# Patient Record
Sex: Female | Born: 1995 | Race: White | Hispanic: No | Smoking: Never smoker
Health system: Southern US, Community
[De-identification: ages and names within clinical notes are randomized; demographics above are authoritative.]

## PROBLEM LIST (undated history)

## (undated) DIAGNOSIS — E282 Polycystic ovarian syndrome: Secondary | ICD-10-CM

## (undated) DIAGNOSIS — J45909 Unspecified asthma, uncomplicated: Secondary | ICD-10-CM

## (undated) HISTORY — PX: WISDOM TOOTH EXTRACTION: SHX21

## (undated) HISTORY — PX: TONSILLECTOMY: SUR1361

## (undated) HISTORY — DX: Polycystic ovarian syndrome: E28.2

---

## 2017-08-15 DIAGNOSIS — E282 Polycystic ovarian syndrome: Secondary | ICD-10-CM | POA: Insufficient documentation

## 2020-03-28 ENCOUNTER — Emergency Department (HOSPITAL_COMMUNITY): Admission: EM | Admit: 2020-03-28 | Discharge: 2020-03-28 | Discharge status: Against Medical Advice | Payer: Self-pay

## 2020-03-28 ENCOUNTER — Other Ambulatory Visit: Payer: Self-pay

## 2020-12-17 ENCOUNTER — Other Ambulatory Visit: Payer: Self-pay

## 2020-12-17 ENCOUNTER — Encounter (HOSPITAL_COMMUNITY): Payer: Self-pay | Admitting: Emergency Medicine

## 2020-12-17 ENCOUNTER — Emergency Department (HOSPITAL_COMMUNITY): Payer: No Typology Code available for payment source

## 2020-12-17 ENCOUNTER — Emergency Department (HOSPITAL_COMMUNITY)
Admission: EM | Admit: 2020-12-17 | Discharge: 2020-12-17 | Disposition: A | Discharge status: Home / Self Care | Payer: No Typology Code available for payment source | Attending: Emergency Medicine | Admitting: Emergency Medicine

## 2020-12-17 DIAGNOSIS — S8001XA Contusion of right knee, initial encounter: Secondary | ICD-10-CM | POA: Diagnosis not present

## 2020-12-17 DIAGNOSIS — J45909 Unspecified asthma, uncomplicated: Secondary | ICD-10-CM | POA: Diagnosis not present

## 2020-12-17 DIAGNOSIS — S8991XA Unspecified injury of right lower leg, initial encounter: Secondary | ICD-10-CM | POA: Diagnosis present

## 2020-12-17 DIAGNOSIS — M7918 Myalgia, other site: Secondary | ICD-10-CM

## 2020-12-17 DIAGNOSIS — Y92410 Unspecified street and highway as the place of occurrence of the external cause: Secondary | ICD-10-CM | POA: Diagnosis not present

## 2020-12-17 HISTORY — DX: Unspecified asthma, uncomplicated: J45.909
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
## 2020-12-17 NOTE — Discharge Instructions (Signed)
You can take 600 mg of ibuprofen every 6 hours, you can take 1000 mg of Tylenol every 6 hours, you can alternate these every 3 or you can take them together.  Bear weight as tolerated with the crutches.  I start to feel better he can put weight on it.  If it is not getting better in a few days seek reevaluation as we may have missed a very small fracture.  Rest and elevate and ice the knee is much as possible.  Follow-up with your primary care

## 2020-12-17 NOTE — ED Notes (Signed)
E-signature pad unavailable at time of pt discharge. This RN discussed discharge materials with pt and answered all pt questions. Pt stated understanding of discharge material. ? ?
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
## 2021-01-06 ENCOUNTER — Other Ambulatory Visit: Payer: Self-pay | Admitting: Chiropractic Medicine

## 2021-01-06 DIAGNOSIS — M25561 Pain in right knee: Secondary | ICD-10-CM

## 2021-01-14 ENCOUNTER — Other Ambulatory Visit: Payer: Medicaid Other

## 2021-01-16 ENCOUNTER — Other Ambulatory Visit: Payer: Self-pay

## 2021-01-16 ENCOUNTER — Ambulatory Visit
Admission: RE | Admit: 2021-01-16 | Discharge: 2021-01-16 | Disposition: A | Discharge status: Home / Self Care | Payer: PRIVATE HEALTH INSURANCE | Source: Ambulatory Visit | Attending: Chiropractic Medicine | Admitting: Chiropractic Medicine

## 2021-01-16 DIAGNOSIS — M25561 Pain in right knee: Secondary | ICD-10-CM

## 2021-07-24 NOTE — L&D Delivery Note (Signed)
OB/GYN Faculty Practice Delivery Note  Kaitlyn Peters is a 26 y.o. G1P0 s/p SVD at [redacted]w[redacted]d. She was admitted for SOL/SROM.   ROM: 19h 53m with white, yellow fluid GBS Status:  Negative/-- (07/27 0000) Maximum Maternal Temperature: 100 w/ tachycardia (Treated w/ amp+gent for chorio)  Labor Progress: Initial SVE: 9/90/-1. She then progressed to complete.   Delivery Date/Time: 8/16 @2208   Delivery: Called to room and patient was complete and pushing. Head delivered OA. Loose nuchal cord x1 present; reduced at perineum after delivery. Shoulder and body delivered in usual fashion. Infant with spontaneous cry, placed on mother's abdomen, dried and stimulated. Cord clamped x 2 after 1-minute delay, and cut by FOB. Cord blood drawn. Placenta delivered spontaneously with gentle cord traction. Fundus firm with massage and Pitocin. Labia, perineum, vagina, and cervix inspected and no laceration found.  Baby Weight: pending  Placenta: 3 vessel, intact. Sent to L&D Complications: Prolonged second stage of labor Lacerations: None EBL: 57 mL Analgesia: Epidural   Infant:  APGAR (1 MIN): 9 APGAR (5 MINS): 9  Kaitlyn Alkins Autry-Lott, DO OB Fellow, Faculty , [facility] 03/08/2022, 10:51 PM

## 2021-07-27 ENCOUNTER — Encounter: Payer: Self-pay | Admitting: Advanced Practice Midwife

## 2021-07-27 NOTE — Progress Notes (Signed)
Pt states last pap was 2 years ago- normal

## 2021-08-02 ENCOUNTER — Encounter: Payer: Self-pay | Admitting: Advanced Practice Midwife

## 2021-08-02 ENCOUNTER — Other Ambulatory Visit: Payer: Self-pay

## 2021-08-02 ENCOUNTER — Ambulatory Visit (INDEPENDENT_AMBULATORY_CARE_PROVIDER_SITE_OTHER): Payer: Medicaid Other | Admitting: Advanced Practice Midwife

## 2021-08-02 ENCOUNTER — Other Ambulatory Visit (HOSPITAL_COMMUNITY)
Admission: RE | Admit: 2021-08-02 | Discharge: 2021-08-02 | Disposition: A | Discharge status: Home / Self Care | Payer: Medicaid Other | Source: Ambulatory Visit | Attending: Advanced Practice Midwife | Admitting: Advanced Practice Midwife

## 2021-08-02 VITALS — BP 102/67 | HR 86 | Wt 129.0 lb

## 2021-08-02 DIAGNOSIS — Z3490 Encounter for supervision of normal pregnancy, unspecified, unspecified trimester: Secondary | ICD-10-CM | POA: Insufficient documentation

## 2021-08-02 DIAGNOSIS — Z3480 Encounter for supervision of other normal pregnancy, unspecified trimester: Secondary | ICD-10-CM | POA: Diagnosis not present

## 2021-08-02 DIAGNOSIS — Z3A08 8 weeks gestation of pregnancy: Secondary | ICD-10-CM

## 2021-08-02 DIAGNOSIS — O219 Vomiting of pregnancy, unspecified: Secondary | ICD-10-CM

## 2021-08-02 DIAGNOSIS — O26891 Other specified pregnancy related conditions, first trimester: Secondary | ICD-10-CM | POA: Diagnosis not present

## 2021-08-02 MED ORDER — DOXYLAMINE-PYRIDOXINE 10-10 MG PO TBEC: DELAYED_RELEASE_TABLET | ORAL | 5 refills | Status: DC

## 2021-08-02 NOTE — Progress Notes (Signed)
Subjective:   Majorie Allanson is a 26 y.o. G1P0 at [redacted]w[redacted]d by early ultrasound being seen today for her first obstetrical visit.  Her obstetrical history is significant for  PCOS  and has Supervision of normal pregnancy and PCOS (polycystic ovarian syndrome) on their problem list.. Patient does intend to breast feed. Pregnancy history fully reviewed.  Patient reports  n/v and breast tenderness .  HISTORY: OB History  Gravida Para Term Preterm AB Living  1 0 0 0 0 0  SAB IAB Ectopic Multiple Live Births  0 0 0 0 0    # Outcome Date GA Lbr Len/2nd Weight Sex Delivery Anes PTL Lv  1 Current            Past Medical History:  Diagnosis Date   Asthma    PCOS (polycystic ovarian syndrome)    Past Surgical History:  Procedure Laterality Date   TONSILLECTOMY     Family History  Problem Relation Age of Onset   Breast cancer Maternal Grandmother    Breast cancer Paternal Grandmother    Social History   Tobacco Use   Smoking status: Never   Smokeless tobacco: Never  Vaping Use   Vaping Use: Never used  Substance Use Topics   Alcohol use: Never   Drug use: Never   No Known Allergies Current Outpatient Medications on File Prior to Visit  Medication Sig Dispense Refill   doxylamine, Sleep, (UNISOM) 25 MG tablet Take 25 mg by mouth at bedtime as needed.     Prenatal Vit-Fe Fumarate-FA (MULTIVITAMIN-PRENATAL) 27-0.8 MG TABS tablet Take 1 tablet by mouth daily at 12 noon.     promethazine (PHENERGAN) 25 MG suppository Place rectally.     No current facility-administered medications on file prior to visit.     Indications for ASA therapy (per uptodate) One of the following: Previous pregnancy with preeclampsia, especially early onset and with an adverse outcome No Multifetal gestation No Chronic hypertension No Type 1 or 2 diabetes mellitus No Chronic kidney disease No Autoimmune disease (antiphospholipid syndrome, systemic lupus erythematosus) No   Two or more of the  following: Nulliparity Yes Obesity (body mass index >30 kg/m2) No Family history of preeclampsia in mother or sister No Age ?35 years No Sociodemographic characteristics (African American race, low socioeconomic level) No Personal risk factors (eg, previous pregnancy with low birth weight or small for gestational age infant, previous adverse pregnancy outcome [eg, stillbirth], interval >10 years between pregnancies) No   Indications for early 1 hour GTT (per uptodate)  BMI >25 (>23 in Asian women) AND one of the following No,BMI 24   Exam   Vitals:   08/02/21 1404  BP: 102/67  Pulse: 86  Weight: 129 lb (58.5 kg)   Fetal Heart Rate (bpm): 165  Uterus:     Pelvic Exam: Perineum: no hemorrhoids, normal perineum   Vulva: normal external genitalia, no lesions   Vagina:  normal mucosa, normal discharge   Cervix: no lesions and normal, pap smear done.    Adnexa: normal adnexa and no mass, fullness, tenderness   Bony Pelvis: average  System: General: well-developed, well-nourished female in no acute distress   Breast:  normal appearance, no masses or tenderness   Skin: normal coloration and turgor, no rashes   Neurologic: oriented, normal, negative, normal mood   Extremities: normal strength, tone, and muscle mass, ROM of all joints is normal   HEENT PERRLA, extraocular movement intact and sclera clear, anicteric   Mouth/Teeth  mucous membranes moist, pharynx normal without lesions and dental hygiene good   Neck supple and no masses   Cardiovascular: regular rate and rhythm   Respiratory:  no respiratory distress, normal breath sounds   Abdomen: soft, non-tender; bowel sounds normal; no masses,  no organomegaly     Assessment:   Pregnancy: G1P0 Patient Active Problem List   Diagnosis Date Noted   Supervision of normal pregnancy 08/02/2021   PCOS (polycystic ovarian syndrome) 08/15/2017     Plan:    1. Supervision of other normal pregnancy, antepartum --Anticipatory  guidance about next visits/weeks of pregnancy given. --Next visit in 4 weeks --NIPS in 4 weeks  - Obstetric panel - HIV antibody (with reflex) - Hepatitis C Antibody - Culture, OB Urine - GC/Chlamydia probe amp ([place])not at [facility] - [place] bedside; Future - Babyscripts Schedule Optimization   2. Nausea and vomiting during pregnancy prior to [redacted] weeks gestation --Pt taking Phenergan given by a relative, helps but causes drowsiness. --Discussed dietary changes for nausea --Rx for Diclegis, start at night, then add daytime doses PRN - Doxylamine-Pyridoxine (DICLEGIS) 10-10 MG TBEC; Take 2 tabs at bedtime. If needed, add another tab in the morning. If needed, add another tab in the afternoon, up to 4 tabs/day.  Dispense: 100 tablet; Refill: 5   Initial labs drawn. Continue prenatal vitamins. Discussed and offered genetic screening options, including Quad screen/AFP, NIPS testing, and option to decline testing. Benefits/risks/alternatives reviewed. Pt aware that anatomy US is form of genetic screening with lower accuracy in detecting trisomies than blood work.  Pt chooses/declines genetic screening today. NIPS: requested. Ultrasound discussed; fetal anatomic survey: requested. Problem list reviewed and updated. The nature of [facility] with multiple MDs and other Advanced Practice Providers was explained to patient; also emphasized that residents, students are part of our team. Routine obstetric precautions reviewed. Return in about 4 weeks (around 08/30/2021).   Fatima Blank, CNM 08/02/21 3:34 PM

## 2021-08-02 NOTE — Progress Notes (Signed)
Bedside U/S shows single IUP with FHT of 165 BPM and CRL 18.48mm  GA is [redacted]w[redacted]d.  Pt does state that she has PCOS

## 2021-08-03 LAB — GC/CHLAMYDIA PROBE AMP (~~LOC~~) NOT AT ARMC
Chlamydia: NEGATIVE
Comment: NEGATIVE
Comment: NORMAL
Neisseria Gonorrhea: NEGATIVE

## 2021-08-04 LAB — OBSTETRIC PANEL
Absolute Monocytes: 646 cells/uL (ref 200–950)
Antibody Screen: NOT DETECTED
Basophils Absolute: 29 cells/uL (ref 0–200)
Basophils Relative: 0.3 %
Eosinophils Absolute: 38 cells/uL (ref 15–500)
Eosinophils Relative: 0.4 %
HCT: 38.9 % (ref 35.0–45.0)
Hemoglobin: 12.9 g/dL (ref 11.7–15.5)
Hepatitis B Surface Ag: NONREACTIVE
Lymphs Abs: 1720 cells/uL (ref 850–3900)
MCH: 30.3 pg (ref 27.0–33.0)
MCHC: 33.2 g/dL (ref 32.0–36.0)
MCV: 91.3 fL (ref 80.0–100.0)
MPV: 11.7 fL (ref 7.5–12.5)
Monocytes Relative: 6.8 %
Neutro Abs: 7068 cells/uL (ref 1500–7800)
Neutrophils Relative %: 74.4 %
Platelets: 283 10*3/uL (ref 140–400)
RBC: 4.26 10*6/uL (ref 3.80–5.10)
RDW: 12 % (ref 11.0–15.0)
RPR Ser Ql: NONREACTIVE
Rubella: 4.98 Index
Total Lymphocyte: 18.1 %
WBC: 9.5 10*3/uL (ref 3.8–10.8)

## 2021-08-04 LAB — HEPATITIS C ANTIBODY
Hepatitis C Ab: NONREACTIVE
SIGNAL TO CUT-OFF: 0.08 (ref <1.00)

## 2021-08-04 LAB — CULTURE, OB URINE

## 2021-08-04 LAB — HIV ANTIBODY (ROUTINE TESTING W REFLEX): HIV 1&2 Ab, 4th Generation: NONREACTIVE

## 2021-08-04 LAB — URINE CULTURE, OB REFLEX: Organism ID, Bacteria: NO GROWTH

## 2021-08-16 ENCOUNTER — Inpatient Hospital Stay (HOSPITAL_COMMUNITY)
Admission: AD | Admit: 2021-08-16 | Discharge: 2021-08-16 | Disposition: A | Discharge status: Home / Self Care | Payer: Medicaid Other | Attending: Obstetrics and Gynecology | Admitting: Obstetrics and Gynecology

## 2021-08-16 ENCOUNTER — Encounter (HOSPITAL_COMMUNITY): Payer: Self-pay | Admitting: Obstetrics and Gynecology

## 2021-08-16 ENCOUNTER — Other Ambulatory Visit: Payer: Self-pay

## 2021-08-16 ENCOUNTER — Encounter: Payer: Self-pay | Admitting: Advanced Practice Midwife

## 2021-08-16 DIAGNOSIS — O210 Mild hyperemesis gravidarum: Secondary | ICD-10-CM | POA: Insufficient documentation

## 2021-08-16 DIAGNOSIS — Z3491 Encounter for supervision of normal pregnancy, unspecified, first trimester: Secondary | ICD-10-CM

## 2021-08-16 DIAGNOSIS — O99611 Diseases of the digestive system complicating pregnancy, first trimester: Secondary | ICD-10-CM | POA: Insufficient documentation

## 2021-08-16 DIAGNOSIS — K529 Noninfective gastroenteritis and colitis, unspecified: Secondary | ICD-10-CM | POA: Insufficient documentation

## 2021-08-16 DIAGNOSIS — O219 Vomiting of pregnancy, unspecified: Secondary | ICD-10-CM

## 2021-08-16 DIAGNOSIS — Z3A10 10 weeks gestation of pregnancy: Secondary | ICD-10-CM | POA: Insufficient documentation

## 2021-08-16 DIAGNOSIS — R109 Unspecified abdominal pain: Secondary | ICD-10-CM | POA: Diagnosis present

## 2021-08-16 LAB — COMPREHENSIVE METABOLIC PANEL
ALT: 11 U/L (ref 0–44)
AST: 18 U/L (ref 15–41)
Albumin: 3.7 g/dL (ref 3.5–5.0)
Alkaline Phosphatase: 44 U/L (ref 38–126)
Anion gap: 20 — ABNORMAL HIGH (ref 5–15)
BUN: 7 mg/dL (ref 6–20)
CO2: 22 mmol/L (ref 22–32)
Calcium: 10.5 mg/dL — ABNORMAL HIGH (ref 8.9–10.3)
Chloride: 97 mmol/L — ABNORMAL LOW (ref 98–111)
Creatinine, Ser: 0.58 mg/dL (ref 0.44–1.00)
GFR, Estimated: >60 mL/min (ref >60)
Glucose, Bld: 73 mg/dL (ref 70–99)
Potassium: 3.4 mmol/L — ABNORMAL LOW (ref 3.5–5.1)
Sodium: 139 mmol/L (ref 135–145)
Total Bilirubin: 0.7 mg/dL (ref 0.3–1.2)
Total Protein: 6.7 g/dL (ref 6.5–8.1)

## 2021-08-16 LAB — CBC WITH DIFFERENTIAL/PLATELET
Abs Immature Granulocytes: 0.02 10*3/uL (ref 0.00–0.07)
Basophils Absolute: 0 10*3/uL (ref 0.0–0.1)
Basophils Relative: 0 %
Eosinophils Absolute: 0 10*3/uL (ref 0.0–0.5)
Eosinophils Relative: 0 %
HCT: 37 % (ref 36.0–46.0)
Hemoglobin: 12.9 g/dL (ref 12.0–15.0)
Immature Granulocytes: 0 %
Lymphocytes Relative: 18 %
Lymphs Abs: 1.9 10*3/uL (ref 0.7–4.0)
MCH: 30.5 pg (ref 26.0–34.0)
MCHC: 34.9 g/dL (ref 30.0–36.0)
MCV: 87.5 fL (ref 80.0–100.0)
Monocytes Absolute: 0.5 10*3/uL (ref 0.1–1.0)
Monocytes Relative: 5 %
Neutro Abs: 8.4 10*3/uL — ABNORMAL HIGH (ref 1.7–7.7)
Neutrophils Relative %: 77 %
Platelets: 198 10*3/uL (ref 150–400)
RBC: 4.23 MIL/uL (ref 3.87–5.11)
RDW: 11.9 % (ref 11.5–15.5)
WBC: 11 10*3/uL — ABNORMAL HIGH (ref 4.0–10.5)
nRBC: 0 % (ref 0.0–0.2)

## 2021-08-16 LAB — URINALYSIS, ROUTINE W REFLEX MICROSCOPIC
Bilirubin Urine: NEGATIVE
Glucose, UA: NEGATIVE mg/dL
Ketones, ur: 20 mg/dL — AB
Leukocytes,Ua: NEGATIVE
Nitrite: NEGATIVE
Protein, ur: NEGATIVE mg/dL
Specific Gravity, Urine: 1.025 (ref 1.005–1.030)
pH: 5 (ref 5.0–8.0)

## 2021-08-16 MED ORDER — METOCLOPRAMIDE HCL 10 MG PO TABS: 10.0000 mg | ORAL_TABLET | Freq: Four times a day (QID) | ORAL | 0 refills | Status: DC

## 2021-08-16 MED ORDER — LACTATED RINGERS IV BOLUS
1000.0000 mL | Freq: Once | INTRAVENOUS | Status: AC
  Administered 2021-08-16: 19:00:00 1000 mL via INTRAVENOUS

## 2021-08-16 MED ORDER — METOCLOPRAMIDE HCL 5 MG/ML IJ SOLN
10.0000 mg | Freq: Once | INTRAMUSCULAR | Status: AC
  Administered 2021-08-16: 19:00:00 10 mg via INTRAVENOUS
  Filled 2021-08-16: qty 2

## 2021-08-16 NOTE — MAU Note (Signed)
Presents with c/o N/V and diarrhea that began last night.  Reports unable to keep anything down since last night.  States took Diclegis and used phenergan suppository but unable to retain.

## 2021-08-16 NOTE — Discharge Instructions (Signed)

## 2021-08-16 NOTE — MAU Provider Note (Addendum)
History     CSN: [id number]  Arrival date and time: 08/16/21 1535   Event Date/Time   First Provider Initiated Contact with Patient 08/16/21 1805      Chief Complaint  Patient presents with   Emesis   Nausea   Diarrhea   Emesis  Associated symptoms include abdominal pain, chills and diarrhea. Pertinent negatives include no chest pain, coughing or fever.  Ms. Shanley Furlough is a 26 y.o. G1P0 at [redacted]w[redacted]d who presents to MAU today with complaint of nausea, vomiting, and diarrhea that began last night. Normally vomits 1-2x this pregnancy, but has vomited about 4-5x today and reports more bile-like/yellow color. Has been unable to tolerate any oral intake. Admits abdominal pain which also began last night which was originally relieved with defecation, but now reports constant 4-5/10 pain. Denies fever, blood in stool. Admits chills, fatigue. Has tried PO Diclegis and rectal Phenergan, but unable to keep either down secondary to vomiting and diarrhea. No new foods introduced to diet and no sick contacts, all home-cooked meals.   OB History     Gravida  1   Para      Term      Preterm      AB      Living         SAB      IAB      Ectopic      Multiple      Live Births              Past Medical History:  Diagnosis Date   Asthma    PCOS (polycystic ovarian syndrome)     Past Surgical History:  Procedure Laterality Date   TONSILLECTOMY      Family History  Problem Relation Age of Onset   Breast cancer Maternal Grandmother    Breast cancer Paternal Grandmother     Social History   Tobacco Use   Smoking status: Never   Smokeless tobacco: Never  Vaping Use   Vaping Use: Never used  Substance Use Topics   Alcohol use: Never   Drug use: Never    Allergies: No Known Allergies  Medications Prior to Admission  Medication Sig Dispense Refill Last Dose   doxylamine, Sleep, (UNISOM) 25 MG tablet Take 25 mg by mouth at bedtime as needed.   08/16/2021    Doxylamine-Pyridoxine (DICLEGIS) 10-10 MG TBEC Take 2 tabs at bedtime. If needed, add another tab in the morning. If needed, add another tab in the afternoon, up to 4 tabs/day. 100 tablet 5 08/16/2021   Prenatal Vit-Fe Fumarate-FA (MULTIVITAMIN-PRENATAL) 27-0.8 MG TABS tablet Take 1 tablet by mouth daily at 12 noon.   08/16/2021   promethazine (PHENERGAN) 25 MG suppository Place rectally.   Past Week    Review of Systems  Constitutional:  Positive for chills and fatigue. Negative for fever.  Respiratory:  Negative for cough and shortness of breath.   Cardiovascular:  Negative for chest pain.  Gastrointestinal:  Positive for abdominal pain, diarrhea, nausea and vomiting. Negative for blood in stool.  Genitourinary:  Positive for frequency. Negative for dysuria and urgency.   Physical Exam   Blood pressure 102/63, pulse 80, temperature 98.2 F (36.8 C), temperature source Oral, resp. rate 20, height 5\' 1"  (1.549 m), weight 59.2 kg, last menstrual period 05/20/2021, SpO2 99 %.  Physical Exam Vitals and nursing note reviewed.  Constitutional:      Appearance: Normal appearance.  Cardiovascular:     Rate and Rhythm:  Normal rate and regular rhythm.     Heart sounds: Normal heart sounds.  Pulmonary:     Effort: Pulmonary effort is normal.     Breath sounds: Normal breath sounds.  Abdominal:     General: Bowel sounds are normal.     Palpations: Abdomen is soft.     Tenderness: There is abdominal tenderness in the periumbilical area and left upper quadrant.  Neurological:     Mental Status: She is alert.    MAU Course  Procedures  UA shows hazy urine, large Hgb dipstick and few bacteria. Urine culture ordered. CMP, CBC ordered.  Given IV Phenergan and bolus LR.  Assessment and Plan  Acute gastroenteritis  Ronney Asters, Student-PA   CNM attestation:  I have seen and examined this patient and agree with above documentation in the PA student's note.   Kamree Wiens is a 26 y.o. G1P0  at [redacted]w[redacted]d reporting nausea, vomiting, and diarrhea that started last night. Reports symptoms started after eating dinner last night. Reports 4-5 episodes of vomiting today and 3 episodes of watery diarrhea. She reports that she tried to take both diclegis and phenergan suppository, however was unable to keep diclegis down and phenergan came out after an episode of diarrhea. She reports some mild abdominal cramping, however denies vaginal bleeding or discharge. She denies fever, chills, urinary s/s or exposure to sick contacts.   PE: Patient Vitals for the past 24 hrs:  BP Temp Temp src Pulse Resp SpO2 Height Weight  08/16/21 1606 102/63 98.2 F (36.8 C) Oral 80 20 99 % -- --  08/16/21 1600 -- -- -- -- -- -- 5\' 1"  (1.549 m) 59.2 kg   Gen: calm comfortable, NAD Resp: normal effort, no distress Heart: regular rate Abd: soft, nontender  FHR: 172 bpm via doppler  ROS, labs, PMH reviewed  Orders Placed This Encounter  Procedures   OB Urine Culture   Urinalysis, Routine w reflex microscopic Urine, Clean Catch   CBC with Differential/Platelet   Comprehensive metabolic panel   Insert peripheral IV   Discharge patient   Meds ordered this encounter  Medications   lactated ringers bolus 1,000 mL   metoCLOPramide (REGLAN) injection 10 mg   metoCLOPramide (REGLAN) 10 MG tablet    Sig: Take 1 tablet (10 mg total) by mouth every 6 (six) hours.    Dispense:  30 tablet    Refill:  0    Order Specific Question:   Supervising Provider    Answer:   [[id number]]    MDM UA, culture pending IV fluids and IV Reglan given CBC, CMP reassuring. No electrolyte abnormalities Patient was able to tolerate PO fluids and crackers. Reports feeling much better on reassessment.  Assessment: [redacted] weeks gestation of pregnancy Nausea and vomiting affecting pregnancy Acute gastroenteritis    Plan: - Discharge home in stable condition - Rx for Reglan sent to pharmacy - Strict return precautions  reviewed. Return to MAU sooner or as needed for worsening symptoms - Follow-up as scheduled at your doctor's office for next prenatal visit or sooner as needed if symptoms worsen.    Reva Bores, CNM 08/16/2021 9:03 PM

## 2021-08-18 LAB — CULTURE, OB URINE: Culture: 50000 — AB

## 2021-08-30 ENCOUNTER — Other Ambulatory Visit: Payer: Self-pay

## 2021-08-30 ENCOUNTER — Ambulatory Visit (INDEPENDENT_AMBULATORY_CARE_PROVIDER_SITE_OTHER): Payer: Medicaid Other | Admitting: Advanced Practice Midwife

## 2021-08-30 VITALS — BP 106/64 | HR 86 | Wt 133.0 lb

## 2021-08-30 DIAGNOSIS — R102 Pelvic and perineal pain: Secondary | ICD-10-CM

## 2021-08-30 DIAGNOSIS — Z3A12 12 weeks gestation of pregnancy: Secondary | ICD-10-CM | POA: Diagnosis not present

## 2021-08-30 DIAGNOSIS — O26891 Other specified pregnancy related conditions, first trimester: Secondary | ICD-10-CM

## 2021-08-30 DIAGNOSIS — Z3A19 19 weeks gestation of pregnancy: Secondary | ICD-10-CM

## 2021-08-30 DIAGNOSIS — Z3491 Encounter for supervision of normal pregnancy, unspecified, first trimester: Secondary | ICD-10-CM

## 2021-08-30 DIAGNOSIS — O219 Vomiting of pregnancy, unspecified: Secondary | ICD-10-CM

## 2021-08-30 NOTE — Progress Notes (Signed)
° °  PRENATAL VISIT NOTE  Subjective:  Kaitlyn Peters is a 26 y.o. G1P0 at [redacted]w[redacted]d being seen today for ongoing prenatal care.  She is currently monitored for the following issues for this low-risk pregnancy and has Supervision of normal pregnancy and PCOS (polycystic ovarian syndrome) on their problem list.  Patient reports  round ligament pain, pelvic/hip pain making uncomfortable for sleep .  Contractions: Not present. Vag. Bleeding: None.  Movement: Absent. Denies leaking of fluid.   The following portions of the patient's history were reviewed and updated as appropriate: allergies, current medications, past family history, past medical history, past social history, past surgical history and problem list.   Objective:   Vitals:   08/30/21 1338  BP: 106/64  Pulse: 86  Weight: 133 lb (60.3 kg)    Fetal Status: Fetal Heart Rate (bpm): 158   Movement: Absent     General:  Alert, oriented and cooperative. Patient is in no acute distress.  Skin: Skin is warm and dry. No rash noted.   Cardiovascular: Normal heart rate noted  Respiratory: Normal respiratory effort, no problems with respiration noted  Abdomen: Soft, gravid, appropriate for gestational age.  Pain/Pressure: Absent     Pelvic: Cervical exam deferred        Extremities: Normal range of motion.  Edema: None  Mental Status: Normal mood and affect. Normal behavior. Normal judgment and thought content.   Assessment and Plan:  Pregnancy: G1P0 at [redacted]w[redacted]d 1. Encounter for supervision of normal pregnancy in first trimester, unspecified gravidity --Anticipatory guidance about next visits/weeks of pregnancy given.  --next visit in 7 weeks, at 20 weeks per Baby Rx opt schedule  - Culture, OB Urine - Genetic Screening - US MFM OB COMP + 14 WK; Future 2. [redacted] weeks gestation of pregnancy   3. Nausea and vomiting during pregnancy prior to [redacted] weeks gestation --improved at this time, not taking medications  4. Pelvic pain affecting  pregnancy in first trimester, antepartum --Mild, mostly making it difficulty to get comfortable to sleep --Rest/ice/heat/warm bath/increase PO fluids/Tylenol/pregnancy support belt after 19-20 weeks --Urine culture today   Preterm labor symptoms and general obstetric precautions including but not limited to vaginal bleeding, contractions, leaking of fluid and fetal movement were reviewed in detail with the patient. Please refer to After Visit Summary for other counseling recommendations.   Return in about 7 weeks (around 10/18/2021).  Future Appointments  Date Time Provider Department Center  10/18/2021  1:10 PM Rasch, Harolyn Rutherford, NP [facility]    Sharen Counter, CNM

## 2021-09-02 LAB — URINE CULTURE, OB REFLEX

## 2021-09-02 LAB — CULTURE, OB URINE

## 2021-09-08 ENCOUNTER — Encounter: Payer: Self-pay | Admitting: *Deleted

## 2021-09-08 ENCOUNTER — Encounter: Payer: Self-pay | Admitting: Advanced Practice Midwife

## 2021-09-08 DIAGNOSIS — Z3491 Encounter for supervision of normal pregnancy, unspecified, first trimester: Secondary | ICD-10-CM

## 2021-09-13 ENCOUNTER — Other Ambulatory Visit: Payer: Self-pay | Admitting: Advanced Practice Midwife

## 2021-09-13 ENCOUNTER — Encounter: Payer: Self-pay | Admitting: Advanced Practice Midwife

## 2021-09-13 DIAGNOSIS — G43009 Migraine without aura, not intractable, without status migrainosus: Secondary | ICD-10-CM

## 2021-09-13 MED ORDER — BUTALBITAL-APAP-CAFFEINE 50-325-40 MG PO TABS: 1.0000 | ORAL_TABLET | Freq: Four times a day (QID) | ORAL | 0 refills | Status: DC | PRN

## 2021-09-13 NOTE — Progress Notes (Signed)
Pt sent MyChart message with  migraine h/a not resolved with Tylenol and icepacks. Rx for Fioricet sent to pharmacy.  Pt given instructions to go to MAU if h/a not resolved with increased PO fluids, Tylenol with or without caffeine, or Fioricet.

## 2021-10-17 ENCOUNTER — Other Ambulatory Visit: Payer: Self-pay | Admitting: *Deleted

## 2021-10-17 ENCOUNTER — Other Ambulatory Visit: Payer: Self-pay

## 2021-10-17 ENCOUNTER — Ambulatory Visit: Payer: Medicaid Other | Attending: Advanced Practice Midwife

## 2021-10-17 ENCOUNTER — Other Ambulatory Visit: Payer: Self-pay | Admitting: Advanced Practice Midwife

## 2021-10-17 DIAGNOSIS — O358XX0 Maternal care for other (suspected) fetal abnormality and damage, not applicable or unspecified: Secondary | ICD-10-CM | POA: Insufficient documentation

## 2021-10-17 DIAGNOSIS — Z3A19 19 weeks gestation of pregnancy: Secondary | ICD-10-CM | POA: Diagnosis not present

## 2021-10-17 DIAGNOSIS — Z3491 Encounter for supervision of normal pregnancy, unspecified, first trimester: Secondary | ICD-10-CM

## 2021-10-17 DIAGNOSIS — O3503X0 Maternal care for (suspected) central nervous system malformation or damage in fetus, choroid plexus cysts, not applicable or unspecified: Secondary | ICD-10-CM

## 2021-10-17 DIAGNOSIS — O321XX0 Maternal care for breech presentation, not applicable or unspecified: Secondary | ICD-10-CM | POA: Diagnosis not present

## 2021-10-17 DIAGNOSIS — Z363 Encounter for antenatal screening for malformations: Secondary | ICD-10-CM | POA: Insufficient documentation

## 2021-10-17 DIAGNOSIS — Z362 Encounter for other antenatal screening follow-up: Secondary | ICD-10-CM

## 2021-10-18 ENCOUNTER — Ambulatory Visit (INDEPENDENT_AMBULATORY_CARE_PROVIDER_SITE_OTHER): Payer: Medicaid Other | Admitting: Obstetrics and Gynecology

## 2021-10-18 DIAGNOSIS — Z3491 Encounter for supervision of normal pregnancy, unspecified, first trimester: Secondary | ICD-10-CM

## 2021-10-18 NOTE — Patient Instructions (Signed)

## 2021-10-18 NOTE — Progress Notes (Signed)
? ?  PRENATAL VISIT NOTE ? ?Subjective:  ?Kaitlyn Peters is a 26 y.o. G1P0 at [redacted]w[redacted]d being seen today for ongoing prenatal care.  She is currently monitored for the following issues for this low-risk pregnancy and has Supervision of normal pregnancy and PCOS (polycystic ovarian syndrome) on their problem list. ? ?Patient reports headache.   . Vag. Bleeding: None.  Movement: Present. Denies leaking of fluid.  ? ?The following portions of the patient's history were reviewed and updated as appropriate: allergies, current medications, past family history, past medical history, past social history, past surgical history and problem list.  ? ?Objective:  ? ?Vitals:  ? 10/18/21 1311  ?BP: 99/64  ?Pulse: 75  ?Weight: 145 lb (65.8 kg)  ? ? ?Fetal Status: Fetal Heart Rate (bpm): 148   Movement: Present    ? ?General:  Alert, oriented and cooperative. Patient is in no acute distress.  ?Skin: Skin is warm and dry. No rash noted.   ?Cardiovascular: Normal heart rate noted  ?Respiratory: Normal respiratory effort, no problems with respiration noted  ?Abdomen: Soft, gravid, appropriate for gestational age.  Pain/Pressure: Absent     ?Pelvic: Cervical exam deferred        ?Extremities: Normal range of motion.  Edema: None  ?Mental Status: Normal mood and affect. Normal behavior. Normal judgment and thought content.  ? ?Assessment and Plan:  ?Pregnancy: G1P0 at [redacted]w[redacted]d ?1. Encounter for supervision of normal pregnancy in first trimester, unspecified gravidity ? ?Doing well ?No concerns ?Requesting pap records again today.  ? ? ?Preterm labor symptoms and general obstetric precautions including but not limited to vaginal bleeding, contractions, leaking of fluid and fetal movement were reviewed in detail with the patient. ?Please refer to After Visit Summary for other counseling recommendations.  ? ?No follow-ups on file. ? ?Future Appointments  ?Date Time Provider Department Center  ?11/17/2021  1:15 PM [facility] NURSE [facility]  ?11/17/2021   1:30 PM [facility] US2 [facility]  ? ? ?Venia Carbon, NP  ?

## 2021-10-21 ENCOUNTER — Encounter: Payer: Self-pay | Admitting: *Deleted

## 2021-11-17 ENCOUNTER — Ambulatory Visit: Payer: Medicaid Other | Admitting: *Deleted

## 2021-11-17 ENCOUNTER — Ambulatory Visit: Payer: Medicaid Other | Attending: Obstetrics and Gynecology

## 2021-11-17 VITALS — BP 106/59 | HR 87

## 2021-11-17 DIAGNOSIS — Z3A23 23 weeks gestation of pregnancy: Secondary | ICD-10-CM

## 2021-11-17 DIAGNOSIS — Z3492 Encounter for supervision of normal pregnancy, unspecified, second trimester: Secondary | ICD-10-CM

## 2021-11-17 DIAGNOSIS — O321XX0 Maternal care for breech presentation, not applicable or unspecified: Secondary | ICD-10-CM | POA: Insufficient documentation

## 2021-11-17 DIAGNOSIS — O358XX0 Maternal care for other (suspected) fetal abnormality and damage, not applicable or unspecified: Secondary | ICD-10-CM | POA: Diagnosis not present

## 2021-11-17 DIAGNOSIS — Z362 Encounter for other antenatal screening follow-up: Secondary | ICD-10-CM | POA: Diagnosis not present

## 2021-11-17 DIAGNOSIS — O3503X0 Maternal care for (suspected) central nervous system malformation or damage in fetus, choroid plexus cysts, not applicable or unspecified: Secondary | ICD-10-CM

## 2021-11-21 ENCOUNTER — Inpatient Hospital Stay (HOSPITAL_COMMUNITY): Payer: Medicaid Other

## 2021-11-21 ENCOUNTER — Inpatient Hospital Stay (HOSPITAL_COMMUNITY)
Admission: AD | Admit: 2021-11-21 | Discharge: 2021-11-22 | Disposition: A | Discharge status: Home / Self Care | Payer: Medicaid Other | Attending: Obstetrics and Gynecology | Admitting: Obstetrics and Gynecology

## 2021-11-21 ENCOUNTER — Other Ambulatory Visit: Payer: Self-pay

## 2021-11-21 ENCOUNTER — Encounter (HOSPITAL_COMMUNITY): Payer: Self-pay | Admitting: Obstetrics and Gynecology

## 2021-11-21 DIAGNOSIS — R109 Unspecified abdominal pain: Secondary | ICD-10-CM | POA: Diagnosis not present

## 2021-11-21 DIAGNOSIS — M549 Dorsalgia, unspecified: Secondary | ICD-10-CM | POA: Insufficient documentation

## 2021-11-21 DIAGNOSIS — O26892 Other specified pregnancy related conditions, second trimester: Secondary | ICD-10-CM | POA: Diagnosis not present

## 2021-11-21 DIAGNOSIS — Z3492 Encounter for supervision of normal pregnancy, unspecified, second trimester: Secondary | ICD-10-CM

## 2021-11-21 DIAGNOSIS — Z3A24 24 weeks gestation of pregnancy: Secondary | ICD-10-CM | POA: Diagnosis not present

## 2021-11-21 DIAGNOSIS — O99891 Other specified diseases and conditions complicating pregnancy: Secondary | ICD-10-CM | POA: Diagnosis not present

## 2021-11-21 LAB — URINALYSIS, ROUTINE W REFLEX MICROSCOPIC
Bilirubin Urine: NEGATIVE
Glucose, UA: NEGATIVE mg/dL
Ketones, ur: NEGATIVE mg/dL
Leukocytes,Ua: NEGATIVE
Nitrite: NEGATIVE
Protein, ur: NEGATIVE mg/dL
Specific Gravity, Urine: 1.017 (ref 1.005–1.030)
pH: 5 (ref 5.0–8.0)

## 2021-11-21 LAB — COMPREHENSIVE METABOLIC PANEL
ALT: 15 U/L (ref 0–44)
AST: 17 U/L (ref 15–41)
Albumin: 2.8 g/dL — ABNORMAL LOW (ref 3.5–5.0)
Alkaline Phosphatase: 82 U/L (ref 38–126)
Anion gap: 9 (ref 5–15)
BUN: 8 mg/dL (ref 6–20)
CO2: 23 mmol/L (ref 22–32)
Calcium: 9.2 mg/dL (ref 8.9–10.3)
Chloride: 103 mmol/L (ref 98–111)
Creatinine, Ser: 0.44 mg/dL (ref 0.44–1.00)
GFR, Estimated: >60 mL/min (ref >60)
Glucose, Bld: 79 mg/dL (ref 70–99)
Potassium: 3.4 mmol/L — ABNORMAL LOW (ref 3.5–5.1)
Sodium: 135 mmol/L (ref 135–145)
Total Bilirubin: 0.4 mg/dL (ref 0.3–1.2)
Total Protein: 6 g/dL — ABNORMAL LOW (ref 6.5–8.1)

## 2021-11-21 LAB — CBC
HCT: 32.4 % — ABNORMAL LOW (ref 36.0–46.0)
Hemoglobin: 11.2 g/dL — ABNORMAL LOW (ref 12.0–15.0)
MCH: 30.5 pg (ref 26.0–34.0)
MCHC: 34.6 g/dL (ref 30.0–36.0)
MCV: 88.3 fL (ref 80.0–100.0)
Platelets: 219 10*3/uL (ref 150–400)
RBC: 3.67 MIL/uL — ABNORMAL LOW (ref 3.87–5.11)
RDW: 12.4 % (ref 11.5–15.5)
WBC: 15.3 10*3/uL — ABNORMAL HIGH (ref 4.0–10.5)
nRBC: 0 % (ref 0.0–0.2)

## 2021-11-21 MED ORDER — ACETAMINOPHEN 500 MG PO TABS
1000.0000 mg | ORAL_TABLET | Freq: Once | ORAL | Status: AC
  Administered 2021-11-21: 1000 mg via ORAL
  Filled 2021-11-21: qty 2

## 2021-11-21 MED ORDER — CYCLOBENZAPRINE HCL 5 MG PO TABS
10.0000 mg | ORAL_TABLET | Freq: Once | ORAL | Status: AC
  Administered 2021-11-21: 10 mg via ORAL
  Filled 2021-11-21: qty 2

## 2021-11-21 NOTE — MAU Provider Note (Signed)
?History  ?  ? ?[id number] ? ?Arrival date and time: 11/21/21 2115 ?  ? ?Chief Complaint  ?Patient presents with  ? Abdominal Pain  ? ? ? ?HPI ?Kaitlyn Peters is a 26 y.o. at [redacted]w[redacted]d by ultrasound who presents for back pain. ?Symptoms started yesterday. Reports constant pain on the right side of her back. Nothing makes better or worse. Hasn't treated symptoms. This evening has felt pain radiate to her abdomen intermittently - occurs about 2 times per hour. Denies fever, n/v/d, dysuria, hematuria, vaginal bleeding, or LOF. Reports good fetal movement. No recent injuries.   ? ?OB History   ? ? Gravida  ?1  ? Para  ?   ? Term  ?   ? Preterm  ?   ? AB  ?   ? Living  ?   ?  ? ? SAB  ?   ? IAB  ?   ? Ectopic  ?   ? Multiple  ?   ? Live Births  ?   ?   ?  ?  ? ? ?Past Medical History:  ?Diagnosis Date  ? Asthma   ? PCOS (polycystic ovarian syndrome)   ? ? ?Past Surgical History:  ?Procedure Laterality Date  ? TONSILLECTOMY    ? WISDOM TOOTH EXTRACTION Bilateral   ? ? ?Family History  ?Problem Relation Age of Onset  ? Breast cancer Maternal Grandmother   ? Breast cancer Paternal Grandmother   ? ? ?No Known Allergies ? ?No current facility-administered medications on file prior to encounter.  ? ?Current Outpatient Medications on File Prior to Encounter  ?Medication Sig Dispense Refill  ? Prenatal Vit-Fe Fumarate-FA (MULTIVITAMIN-PRENATAL) 27-0.8 MG TABS tablet Take 1 tablet by mouth daily at 12 noon.    ? ? ? ?ROS ?Pertinent positives and negative per HPI, all others reviewed and negative ? ?Physical Exam  ? ?BP 98/63   Pulse 75   Temp 98.7 ?F (37.1 ?C) (Oral)   Resp 20   Ht 5\' 1"  (1.549 m)   Wt 70.2 kg   LMP 05/20/2021   SpO2 99%   BMI 29.25 kg/m?  ? ?Patient Vitals for the past 24 hrs: ? BP Temp Temp src Pulse Resp SpO2 Height Weight  ?11/21/21 2255 -- -- -- -- -- 99 % -- --  ?11/21/21 2250 -- -- -- -- -- 100 % -- --  ?11/21/21 2245 -- -- -- -- -- 99 % -- --  ?11/21/21 2240 -- -- -- -- -- 100 % -- --  ?11/21/21 2235  98/63 -- -- 75 -- 99 % -- --  ?11/21/21 2154 108/62 98.7 ?F (37.1 ?C) Oral 85 20 -- 5\' 1"  (1.549 m) 70.2 kg  ? ? ?Physical Exam ?Vitals and nursing note reviewed. Exam conducted with a chaperone present.  ?Constitutional:   ?   General: She is not in acute distress. ?   Appearance: She is well-developed. She is not toxic-appearing.  ?HENT:  ?   Head: Normocephalic and atraumatic.  ?Pulmonary:  ?   Effort: Pulmonary effort is normal. No respiratory distress.  ?Abdominal:  ?   Palpations: Abdomen is soft.  ?   Tenderness: There is no right CVA tenderness or left CVA tenderness.  ?Skin: ?   General: Skin is warm and dry.  ?Neurological:  ?   Mental Status: She is alert.  ?  ? ?Cervical Exam ?Dilation: Closed ?Effacement (%): Thick ?Exam by:: , NP ? ? ?FHT ?Baseline 145, moderate variability, 10x10  accels, variable decel x1 ?Toco: none ? ? ?Labs ?Results for orders placed or performed during the hospital encounter of 11/21/21 (from the past 24 hour(s))  ?Urinalysis, Routine w reflex microscopic     Status: Abnormal  ? Collection Time: 11/21/21 10:13 PM  ?Result Value Ref Range  ? Color, Urine YELLOW YELLOW  ? APPearance HAZY (A) CLEAR  ? Specific Gravity, Urine 1.017 1.005 - 1.030  ? pH 5.0 5.0 - 8.0  ? Glucose, UA NEGATIVE NEGATIVE mg/dL  ? Hgb urine dipstick MODERATE (A) NEGATIVE  ? Bilirubin Urine NEGATIVE NEGATIVE  ? Ketones, ur NEGATIVE NEGATIVE mg/dL  ? Protein, ur NEGATIVE NEGATIVE mg/dL  ? Nitrite NEGATIVE NEGATIVE  ? Leukocytes,Ua NEGATIVE NEGATIVE  ? RBC / HPF 0-5 0 - 5 RBC/hpf  ? WBC, UA 0-5 0 - 5 WBC/hpf  ? Bacteria, UA RARE (A) NONE SEEN  ? Squamous Epithelial / LPF 11-20 0 - 5  ? Mucus PRESENT   ?CBC     Status: Abnormal  ? Collection Time: 11/21/21 11:12 PM  ?Result Value Ref Range  ? WBC 15.3 (H) 4.0 - 10.5 K/uL  ? RBC 3.67 (L) 3.87 - 5.11 MIL/uL  ? Hemoglobin 11.2 (L) 12.0 - 15.0 g/dL  ? HCT 32.4 (L) 36.0 - 46.0 %  ? MCV 88.3 80.0 - 100.0 fL  ? MCH 30.5 26.0 - 34.0 pg  ? MCHC 34.6 30.0 -  36.0 g/dL  ? RDW 12.4 11.5 - 15.5 %  ? Platelets 219 150 - 400 K/uL  ? nRBC 0.0 0.0 - 0.2 %  ?Comprehensive metabolic panel     Status: Abnormal  ? Collection Time: 11/21/21 11:12 PM  ?Result Value Ref Range  ? Sodium 135 135 - 145 mmol/L  ? Potassium 3.4 (L) 3.5 - 5.1 mmol/L  ? Chloride 103 98 - 111 mmol/L  ? CO2 23 22 - 32 mmol/L  ? Glucose, Bld 79 70 - 99 mg/dL  ? BUN 8 6 - 20 mg/dL  ? Creatinine, Ser 0.44 0.44 - 1.00 mg/dL  ? Calcium 9.2 8.9 - 10.3 mg/dL  ? Total Protein 6.0 (L) 6.5 - 8.1 g/dL  ? Albumin 2.8 (L) 3.5 - 5.0 g/dL  ? AST 17 15 - 41 U/L  ? ALT 15 0 - 44 U/L  ? Alkaline Phosphatase 82 38 - 126 U/L  ? Total Bilirubin 0.4 0.3 - 1.2 mg/dL  ? GFR, Estimated >60 >60 mL/min  ? Anion gap 9 5 - 15  ? ? ?Imaging ?US RENAL ? ?Result Date: 11/21/2021 ?CLINICAL DATA:  Right flank pain.  Twenty-four weeks pregnant. EXAM: RENAL / URINARY TRACT ULTRASOUND COMPLETE COMPARISON:  None. FINDINGS: Right Kidney: Renal measurements: 12.3 x 4.3 x 4.9 cm = volume: 134 mL. Echogenicity within normal limits. No mass or hydronephrosis visualized. Left Kidney: Renal measurements: 12.6 x 4.2 x 4.5 cm = volume: 126 mL. Echogenicity within normal limits. No mass or hydronephrosis visualized. Bladder: Appears normal for degree of bladder distention. Bilateral ureteral jets are present. Other: None. IMPRESSION: Normal renal ultrasound. Electronically Signed   By: Darliss Cheney M.D.   On: 11/21/2021 23:39   ? ?MAU Course  ?Procedures ?Lab Orders    ?     Culture, OB Urine    ?     Urinalysis, Routine w reflex microscopic    ?     CBC    ?     Comprehensive metabolic panel    ? ?Meds ordered this encounter  ?Medications  ?  acetaminophen (TYLENOL) tablet 1,000 mg  ? cyclobenzaprine (FLEXERIL) tablet 10 mg  ? cyclobenzaprine (FLEXERIL) 10 MG tablet  ?  Sig: Take 1 tablet (10 mg total) by mouth at bedtime as needed for muscle spasms.  ?  Dispense:  20 tablet  ?  Refill:  0  ?  Order Specific Question:   Supervising Provider  ?  Answer:    CONSTANT, PEGGY [[id number]]  ? ?Imaging Orders    ?     US RENAL    ? ? ?MDM ?Patient reports right back/flank pain. She is afebrile, no CVA tenderness. U/a shows moderate hemoglobin which appears on previous urine samples. Renal ultrasound negative. Labs ordered & reviewed. WBC up to 15 - patient is afebrile & has no signs of infection.  ?Cervix closed/thick. Symptoms improved with flexeril.  ?Assessment and Plan  ? ?1. Back pain affecting pregnancy in second trimester   ?2. [redacted] weeks gestation of pregnancy   ? ?-Rx flexeril ?-urine culture pending ?-Reviewed reasons to return to MAU ? ?Judeth HornErin Charlei Ramsaran, NP ?11/22/21 ?1:14 AM ? ? ?

## 2021-11-21 NOTE — MAU Note (Signed)
Pt says started having back pain yesterday ?Then at 8a- today - pain radiated to abd  ?Friday - started feeling pressure to abd  ?[place]- [place] - called - told to come  ?Last sex- Thursday  ?

## 2021-11-22 DIAGNOSIS — M549 Dorsalgia, unspecified: Secondary | ICD-10-CM

## 2021-11-22 DIAGNOSIS — O99891 Other specified diseases and conditions complicating pregnancy: Secondary | ICD-10-CM

## 2021-11-22 DIAGNOSIS — Z3A24 24 weeks gestation of pregnancy: Secondary | ICD-10-CM | POA: Diagnosis not present

## 2021-11-22 MED ORDER — CYCLOBENZAPRINE HCL 10 MG PO TABS
10.0000 mg | ORAL_TABLET | Freq: Every evening | ORAL | 0 refills | Status: DC | PRN
Start: 2021-11-22 — End: 2022-12-24

## 2021-11-23 LAB — CULTURE, OB URINE: Special Requests: NORMAL

## 2021-12-06 ENCOUNTER — Ambulatory Visit (INDEPENDENT_AMBULATORY_CARE_PROVIDER_SITE_OTHER): Payer: Medicaid Other

## 2021-12-06 VITALS — BP 104/70 | HR 82 | Wt 156.0 lb

## 2021-12-06 DIAGNOSIS — O2242 Hemorrhoids in pregnancy, second trimester: Secondary | ICD-10-CM

## 2021-12-06 DIAGNOSIS — Z3A26 26 weeks gestation of pregnancy: Secondary | ICD-10-CM

## 2021-12-06 DIAGNOSIS — Z3492 Encounter for supervision of normal pregnancy, unspecified, second trimester: Secondary | ICD-10-CM

## 2021-12-06 MED ORDER — HYDROCORTISONE ACETATE 25 MG RE SUPP: 25.0000 mg | Freq: Two times a day (BID) | RECTAL | 1 refills | Status: DC

## 2021-12-06 NOTE — Patient Instructions (Signed)

## 2021-12-06 NOTE — Progress Notes (Signed)
? ?  PRENATAL VISIT NOTE ? ?Subjective:  ?Kaitlyn Peters is a 26 y.o. G1P0 at [redacted]w[redacted]d being seen today for ongoing prenatal care.  She is currently monitored for the following issues for this low-risk pregnancy and has Supervision of normal pregnancy and PCOS (polycystic ovarian syndrome) on their problem list. ? ?Patient reports  hemorrhoids .  Contractions: Not present. Vag. Bleeding: None.  Movement: Present. Denies leaking of fluid.  ? ?The following portions of the patient's history were reviewed and updated as appropriate: allergies, current medications, past family history, past medical history, past social history, past surgical history and problem list.  ? ?Objective:  ? ?Vitals:  ? 12/06/21 0851  ?BP: 104/70  ?Pulse: 82  ?Weight: 156 lb (70.8 kg)  ? ? ?Fetal Status: Fetal Heart Rate (bpm): 149 Fundal Height: 26 cm Movement: Present    ? ?General:  Alert, oriented and cooperative. Patient is in no acute distress.  ?Skin: Skin is warm and dry. No rash noted.   ?Cardiovascular: Normal heart rate noted  ?Respiratory: Normal respiratory effort, no problems with respiration noted  ?Abdomen: Soft, gravid, appropriate for gestational age.  Pain/Pressure: Absent     ?Pelvic: Cervical exam deferred        ?Extremities: Normal range of motion.  Edema: Trace  ?Mental Status: Normal mood and affect. Normal behavior. Normal judgment and thought content.  ? ?Assessment and Plan:  ?Pregnancy: G1P0 at [redacted]w[redacted]d ?1. Encounter for supervision of normal pregnancy in second trimester, unspecified gravidity ?- Routine OB. Doing well ?- GTT and labs today ? ?- 2Hr GTT w/ 1 Hr Carpenter 75 g ?- HIV antibody (with reflex) ?- CBC ?- RPR ? ?2. [redacted] weeks gestation of pregnancy ?- Endorses active fetal movement ?- FH appropriate ? ?3. Hemorrhoids during pregnancy in second trimester ?- Noticed 1 week ago, had some rectal bleeding several days ago but none since ?- Also having constipation, but has not tried anything ?- Patient does have 3 small  external hemorrhoids ?- Increase water and fiber intake. Avoid straining with BM's. List of safe meds provided. May use Miralax and stool softeners. Will rx anusol as well  ? ?- hydrocortisone (ANUSOL-HC) 25 MG suppository; Place 1 suppository (25 mg total) rectally 2 (two) times daily.  Dispense: 12 suppository; Refill: 1 ? ? ?Preterm labor symptoms and general obstetric precautions including but not limited to vaginal bleeding, contractions, leaking of fluid and fetal movement were reviewed in detail with the patient. ?Please refer to After Visit Summary for other counseling recommendations.  ? ?Return in about 2 weeks (around 12/20/2021). ? ?Future Appointments  ?Date Time Provider Department Center  ?12/20/2021  3:30 PM Brand Males, CNM [facility]  ? ? ?Brand Males, CNM ? ?

## 2021-12-07 LAB — 2HR GTT W 1 HR, CARPENTER, 75 G
Glucose, 1 Hr, Gest: 105 mg/dL (ref 65–179)
Glucose, 2 Hr, Gest: 99 mg/dL (ref 65–152)
Glucose, Fasting, Gest: 69 mg/dL (ref 65–91)

## 2021-12-07 LAB — CBC
HCT: 34.4 % — ABNORMAL LOW (ref 35.0–45.0)
Hemoglobin: 11.8 g/dL (ref 11.7–15.5)
MCH: 31 pg (ref 27.0–33.0)
MCHC: 34.3 g/dL (ref 32.0–36.0)
MCV: 90.3 fL (ref 80.0–100.0)
MPV: 11.4 fL (ref 7.5–12.5)
Platelets: 221 10*3/uL (ref 140–400)
RBC: 3.81 10*6/uL (ref 3.80–5.10)
RDW: 12.3 % (ref 11.0–15.0)
WBC: 13.1 10*3/uL — ABNORMAL HIGH (ref 3.8–10.8)

## 2021-12-07 LAB — HIV ANTIBODY (ROUTINE TESTING W REFLEX): HIV 1&2 Ab, 4th Generation: NONREACTIVE

## 2021-12-07 LAB — RPR: RPR Ser Ql: NONREACTIVE

## 2021-12-20 ENCOUNTER — Ambulatory Visit (INDEPENDENT_AMBULATORY_CARE_PROVIDER_SITE_OTHER): Payer: Medicaid Other

## 2021-12-20 VITALS — BP 107/66 | HR 94 | Wt 162.0 lb

## 2021-12-20 DIAGNOSIS — Z3A28 28 weeks gestation of pregnancy: Secondary | ICD-10-CM

## 2021-12-20 DIAGNOSIS — Z3403 Encounter for supervision of normal first pregnancy, third trimester: Secondary | ICD-10-CM

## 2021-12-20 NOTE — Progress Notes (Signed)
   PRENATAL VISIT NOTE  Subjective:  Kaitlyn Peters is a 26 y.o. G1P0 at [redacted]w[redacted]d being seen today for ongoing prenatal care.  She is currently monitored for the following issues for this low-risk pregnancy and has Supervision of normal pregnancy and PCOS (polycystic ovarian syndrome) on their problem list.  Patient reports no complaints.  Contractions: Not present. Vag. Bleeding: None.  Movement: Present. Denies leaking of fluid.   The following portions of the patient's history were reviewed and updated as appropriate: allergies, current medications, past family history, past medical history, past social history, past surgical history and problem list.   Objective:   Vitals:   12/20/21 1526  BP: 107/66  Pulse: 94  Weight: 162 lb (73.5 kg)    Fetal Status: Fetal Heart Rate (bpm): 147   Movement: Present     General:  Alert, oriented and cooperative. Patient is in no acute distress.  Skin: Skin is warm and dry. No rash noted.   Cardiovascular: Normal heart rate noted  Respiratory: Normal respiratory effort, no problems with respiration noted  Abdomen: Soft, gravid, appropriate for gestational age.  Pain/Pressure: Absent     Pelvic: Cervical exam deferred        Extremities: Normal range of motion.  Edema: Trace  Mental Status: Normal mood and affect. Normal behavior. Normal judgment and thought content.   Assessment and Plan:  Pregnancy: G1P0 at [redacted]w[redacted]d 1. Encounter for supervision of normal first pregnancy in third trimester - Routine OB. Doing well, no concerns - Reviewed pap results- LSIL. Patient reports she had something that "scraped my cervix", sounds like colpo. She never had any follow up. No records. Will need follow up pap. Offer at 36 weeks or postpartum  2. [redacted] weeks gestation of pregnancy - Endorses active fetal movement - FH appropriate   Preterm labor symptoms and general obstetric precautions including but not limited to vaginal bleeding, contractions, leaking of  fluid and fetal movement were reviewed in detail with the patient. Please refer to After Visit Summary for other counseling recommendations.   Return in about 2 weeks (around 01/03/2022).  Future Appointments  Date Time Provider Department Center  01/03/2022  3:50 PM Armando Reichert, CNM [facility]    Brand Males, CNM

## 2022-01-03 ENCOUNTER — Ambulatory Visit (INDEPENDENT_AMBULATORY_CARE_PROVIDER_SITE_OTHER): Payer: Medicaid Other | Admitting: Advanced Practice Midwife

## 2022-01-03 VITALS — BP 103/68 | HR 93 | Wt 165.0 lb

## 2022-01-03 DIAGNOSIS — Z3A30 30 weeks gestation of pregnancy: Secondary | ICD-10-CM

## 2022-01-03 DIAGNOSIS — Z3480 Encounter for supervision of other normal pregnancy, unspecified trimester: Secondary | ICD-10-CM

## 2022-01-03 NOTE — Progress Notes (Signed)
   PRENATAL VISIT NOTE  Subjective:  Kaitlyn Peters is a 26 y.o. G1P0 at [redacted]w[redacted]d being seen today for ongoing prenatal care.  She is currently monitored for the following issues for this low-risk pregnancy and has Supervision of normal pregnancy and PCOS (polycystic ovarian syndrome) on their problem list.  Patient reports no complaints.  Contractions: Not present. Vag. Bleeding: None.  Movement: Present. Denies leaking of fluid.   The following portions of the patient's history were reviewed and updated as appropriate: allergies, current medications, past family history, past medical history, past social history, past surgical history and problem list.   Objective:   Vitals:   01/03/22 1559  BP: 103/68  Pulse: 93  Weight: 165 lb (74.8 kg)    Fetal Status: Fetal Heart Rate (bpm): 141 Fundal Height: 30 cm Movement: Present     General:  Alert, oriented and cooperative. Patient is in no acute distress.  Skin: Skin is warm and dry. No rash noted.   Cardiovascular: Normal heart rate noted  Respiratory: Normal respiratory effort, no problems with respiration noted  Abdomen: Soft, gravid, appropriate for gestational age.  Pain/Pressure: Present     Pelvic: Cervical exam deferred        Extremities: Normal range of motion.  Edema: Trace  Mental Status: Normal mood and affect. Normal behavior. Normal judgment and thought content.   Assessment and Plan:  Pregnancy: G1P0 at [redacted]w[redacted]d 1. Supervision of other normal pregnancy, antepartum - routine care  2. [redacted] weeks gestation of pregnancy   Preterm labor symptoms and general obstetric precautions including but not limited to vaginal bleeding, contractions, leaking of fluid and fetal movement were reviewed in detail with the patient. Please refer to After Visit Summary for other counseling recommendations.   Return in about 2 weeks (around 01/17/2022).  Future Appointments  Date Time Provider Department Center  01/19/2022 11:10 AM Milas Hock,  MD [facility]    Thressa Sheller DNP, CNM  01/03/22  4:21 PM

## 2022-01-13 ENCOUNTER — Encounter: Payer: Self-pay | Admitting: Advanced Practice Midwife

## 2022-01-17 NOTE — Progress Notes (Signed)
   PRENATAL VISIT NOTE  Subjective:  Kaitlyn Peters is a 26 y.o. G1P0 at [redacted]w[redacted]d being seen today for ongoing prenatal care.  She is currently monitored for the following issues for this low-risk pregnancy and has Supervision of normal pregnancy and PCOS (polycystic ovarian syndrome) on their problem list.  Patient reports no complaints.  Contractions: Not present. Vag. Bleeding: None.  Movement: Present. Denies leaking of fluid.   The following portions of the patient's history were reviewed and updated as appropriate: allergies, current medications, past family history, past medical history, past social history, past surgical history and problem list.   Objective:   Vitals:   01/19/22 1107  BP: 106/71  Pulse: 90  Weight: 169 lb (76.7 kg)    Fetal Status: Fetal Heart Rate (bpm): 145   Movement: Present     General:  Alert, oriented and cooperative. Patient is in no acute distress.  Skin: Skin is warm and dry. No rash noted.   Cardiovascular: Normal heart rate noted  Respiratory: Normal respiratory effort, no problems with respiration noted  Abdomen: Soft, gravid, appropriate for gestational age.  Pain/Pressure: Present     Pelvic: Cervical exam deferred        Extremities: Normal range of motion.  Edema: Trace  Mental Status: Normal mood and affect. Normal behavior. Normal judgment and thought content.   Assessment and Plan:  Pregnancy: G1P0 at [redacted]w[redacted]d 1. Encounter for supervision of normal first pregnancy in third trimester Discussed contraception. She is initially leaning toward the patch but we reviewed not starting right away and potentially initially impacting milk production although typically transient. We also discussed ppIUD and POP until milk is more established as well. She will consider and we will discuss in the future.  Discussed TDAP and recommended - she reports she had it in our office - we did not document it. Based on documentation, most likely occurred on 5/16  appointment.  Rh positive   Preterm labor symptoms and general obstetric precautions including but not limited to vaginal bleeding, contractions, leaking of fluid and fetal movement were reviewed in detail with the patient. Please refer to After Visit Summary for other counseling recommendations.   Return in about 2 weeks (around 02/02/2022) for OB VISIT, MD or APP.  Future Appointments  Date Time Provider Department Center  02/02/2022  3:50 PM Milas Hock, MD [facility]  02/16/2022  1:50 PM Milas Hock, MD [facility]  02/23/2022  2:50 PM Milas Hock, MD [facility]  02/28/2022  1:30 PM Rasch, Harolyn Rutherford, NP [facility]  03/06/2022  1:50 PM Lesly Dukes, MD [facility]  03/14/2022  1:30 PM Donette Larry, CNM [facility]    Milas Hock, MD

## 2022-01-19 ENCOUNTER — Ambulatory Visit (INDEPENDENT_AMBULATORY_CARE_PROVIDER_SITE_OTHER): Payer: Medicaid Other | Admitting: Obstetrics and Gynecology

## 2022-01-19 ENCOUNTER — Encounter: Payer: Self-pay | Admitting: Obstetrics and Gynecology

## 2022-01-19 VITALS — BP 106/71 | HR 90 | Wt 169.0 lb

## 2022-01-19 DIAGNOSIS — Z3403 Encounter for supervision of normal first pregnancy, third trimester: Secondary | ICD-10-CM

## 2022-01-19 DIAGNOSIS — Z3A32 32 weeks gestation of pregnancy: Secondary | ICD-10-CM

## 2022-01-30 NOTE — Progress Notes (Unsigned)
   PRENATAL VISIT NOTE  Subjective:  Kaitlyn Peters is a 26 y.o. G1P0 at [redacted]w[redacted]d being seen today for ongoing prenatal care.  She is currently monitored for the following issues for this low-risk pregnancy and has Supervision of normal pregnancy and PCOS (polycystic ovarian syndrome) on their problem list.  Patient reports {sx:14538}.   .  .   . Denies leaking of fluid.   The following portions of the patient's history were reviewed and updated as appropriate: allergies, current medications, past family history, past medical history, past social history, past surgical history and problem list.   Objective:  There were no vitals filed for this visit.  Fetal Status:           General:  Alert, oriented and cooperative. Patient is in no acute distress.  Skin: Skin is warm and dry. No rash noted.   Cardiovascular: Normal heart rate noted  Respiratory: Normal respiratory effort, no problems with respiration noted  Abdomen: Soft, gravid, appropriate for gestational age.        Pelvic: {Blank single:19197::"Cervical exam performed in the presence of a chaperone","Cervical exam deferred"}        Extremities: Normal range of motion.     Mental Status: Normal mood and affect. Normal behavior. Normal judgment and thought content.   Assessment and Plan:  Pregnancy: G1P0 at [redacted]w[redacted]d 1. Encounter for supervision of normal first pregnancy in third trimester Continue routine PNC  Preterm labor symptoms and general obstetric precautions including but not limited to vaginal bleeding, contractions, leaking of fluid and fetal movement were reviewed in detail with the patient. Please refer to After Visit Summary for other counseling recommendations.   No follow-ups on file.  Future Appointments  Date Time Provider Department Center  02/02/2022  3:50 PM Milas Hock, MD [facility]  02/16/2022  1:50 PM Milas Hock, MD [facility]  02/23/2022  2:50 PM Milas Hock, MD [facility]   02/28/2022  1:30 PM Rasch, Harolyn Rutherford, NP [facility]  03/06/2022  1:50 PM Lesly Dukes, MD [facility]  03/14/2022  1:30 PM Donette Larry, CNM [facility]    Milas Hock, MD

## 2022-02-02 ENCOUNTER — Encounter: Payer: Self-pay | Admitting: Obstetrics and Gynecology

## 2022-02-02 ENCOUNTER — Ambulatory Visit (INDEPENDENT_AMBULATORY_CARE_PROVIDER_SITE_OTHER): Payer: Medicaid Other | Admitting: Obstetrics and Gynecology

## 2022-02-02 VITALS — BP 113/79 | HR 97 | Wt 175.0 lb

## 2022-02-02 DIAGNOSIS — Z3403 Encounter for supervision of normal first pregnancy, third trimester: Secondary | ICD-10-CM

## 2022-02-02 DIAGNOSIS — Z3A34 34 weeks gestation of pregnancy: Secondary | ICD-10-CM

## 2022-02-11 ENCOUNTER — Encounter: Payer: Self-pay | Admitting: Obstetrics and Gynecology

## 2022-02-14 NOTE — Progress Notes (Unsigned)
   PRENATAL VISIT NOTE  Subjective:  Kaitlyn Peters is a 26 y.o. G1P0 at [redacted]w[redacted]d being seen today for ongoing prenatal care.  She is currently monitored for the following issues for this low-risk pregnancy and has Supervision of normal pregnancy and PCOS (polycystic ovarian syndrome) on their problem list.  Patient reports {sx:14538}.   .  .   . Denies leaking of fluid.   The following portions of the patient's history were reviewed and updated as appropriate: allergies, current medications, past family history, past medical history, past social history, past surgical history and problem list.   Objective:  There were no vitals filed for this visit.  Fetal Status:           General:  Alert, oriented and cooperative. Patient is in no acute distress.  Skin: Skin is warm and dry. No rash noted.   Cardiovascular: Normal heart rate noted  Respiratory: Normal respiratory effort, no problems with respiration noted  Abdomen: Soft, gravid, appropriate for gestational age.        Pelvic: Cervical exam performed in the presence of a chaperone        Extremities: Normal range of motion.     Mental Status: Normal mood and affect. Normal behavior. Normal judgment and thought content.   Assessment and Plan:  Pregnancy: G1P0 at [redacted]w[redacted]d 1. Encounter for supervision of normal first pregnancy in third trimester GBS/GC/CT Offered pap smear today - ***   Preterm labor symptoms and general obstetric precautions including but not limited to vaginal bleeding, contractions, leaking of fluid and fetal movement were reviewed in detail with the patient. Please refer to After Visit Summary for other counseling recommendations.   No follow-ups on file.  Future Appointments  Date Time Provider Department Center  02/16/2022  1:50 PM Milas Hock, MD [facility]  02/23/2022  2:50 PM Milas Hock, MD [facility]  02/28/2022  1:30 PM Rasch, Harolyn Rutherford, NP [facility]  03/06/2022  1:50 PM  Lesly Dukes, MD [facility]  03/14/2022  1:30 PM Donette Larry, CNM [facility]    Milas Hock, MD

## 2022-02-16 ENCOUNTER — Ambulatory Visit (INDEPENDENT_AMBULATORY_CARE_PROVIDER_SITE_OTHER): Payer: Medicaid Other | Admitting: Obstetrics and Gynecology

## 2022-02-16 ENCOUNTER — Encounter: Payer: Self-pay | Admitting: Obstetrics and Gynecology

## 2022-02-16 ENCOUNTER — Other Ambulatory Visit (HOSPITAL_COMMUNITY)
Admission: RE | Admit: 2022-02-16 | Discharge: 2022-02-16 | Disposition: A | Discharge status: Home / Self Care | Payer: Medicaid Other | Source: Ambulatory Visit | Attending: Obstetrics and Gynecology | Admitting: Obstetrics and Gynecology

## 2022-02-16 VITALS — BP 118/82 | HR 96 | Wt 177.0 lb

## 2022-02-16 DIAGNOSIS — Z3403 Encounter for supervision of normal first pregnancy, third trimester: Secondary | ICD-10-CM | POA: Diagnosis present

## 2022-02-16 DIAGNOSIS — Z3A36 36 weeks gestation of pregnancy: Secondary | ICD-10-CM

## 2022-02-16 LAB — OB RESULTS CONSOLE GBS: GBS: NEGATIVE

## 2022-02-17 LAB — CERVICOVAGINAL ANCILLARY ONLY
Chlamydia: NEGATIVE
Comment: NEGATIVE
Comment: NORMAL
Neisseria Gonorrhea: NEGATIVE

## 2022-02-17 NOTE — Progress Notes (Unsigned)
   PRENATAL VISIT NOTE  Subjective:  Kaitlyn Peters is a 26 y.o. G1P0 at [redacted]w[redacted]d being seen today for ongoing prenatal care.  She is currently monitored for the following issues for this low-risk pregnancy and has Supervision of normal pregnancy and PCOS (polycystic ovarian syndrome) on their problem list.  Patient reports {[id number]}.   .  .   . Denies leaking of fluid.   The following portions of the patient's history were reviewed and updated as appropriate: allergies, current medications, past family history, past medical history, past social history, past surgical history and problem list.   Objective:  There were no vitals filed for this visit.  Fetal Status:           General:  Alert, oriented and cooperative. Patient is in no acute distress.  Skin: Skin is warm and dry. No rash noted.   Cardiovascular: Normal heart rate noted  Respiratory: Normal respiratory effort, no problems with respiration noted  Abdomen: Soft, gravid, appropriate for gestational age.        Pelvic: Cervical exam deferred        Extremities: Normal range of motion.     Mental Status: Normal mood and affect. Normal behavior. Normal judgment and thought content.   Assessment and Plan:  Pregnancy: G1P0 at [redacted]w[redacted]d 1. Encounter for supervision of normal first pregnancy in third trimester GBS *** Pap pending ***  Term labor symptoms and general obstetric precautions including but not limited to vaginal bleeding, contractions, leaking of fluid and fetal movement were reviewed in detail with the patient. Please refer to After Visit Summary for other counseling recommendations.   No follow-ups on file.  Future Appointments  Date Time Provider Department Center  02/23/2022  2:50 PM Milas Hock, MD [facility]  02/28/2022  1:30 PM Rasch, Harolyn Rutherford, NP [facility]  03/06/2022  1:50 PM Lesly Dukes, MD [facility]  03/14/2022  1:30 PM Donette Larry, CNM [facility]     Milas Hock, MD

## 2022-02-19 LAB — CULTURE, BETA STREP (GROUP B ONLY)
MICRO NUMBER:: 13702724
SPECIMEN QUALITY:: ADEQUATE

## 2022-02-20 LAB — CYTOLOGY - PAP
Adequacy: ABSENT
Chlamydia: NEGATIVE
Comment: NEGATIVE
Comment: NORMAL
Diagnosis: NEGATIVE
Neisseria Gonorrhea: NEGATIVE

## 2022-02-23 ENCOUNTER — Ambulatory Visit (INDEPENDENT_AMBULATORY_CARE_PROVIDER_SITE_OTHER): Payer: Medicaid Other | Admitting: Obstetrics and Gynecology

## 2022-02-23 ENCOUNTER — Encounter: Payer: Self-pay | Admitting: Obstetrics and Gynecology

## 2022-02-23 VITALS — BP 118/84 | HR 89 | Wt 179.0 lb

## 2022-02-23 DIAGNOSIS — Z3A37 37 weeks gestation of pregnancy: Secondary | ICD-10-CM

## 2022-02-23 DIAGNOSIS — Z3403 Encounter for supervision of normal first pregnancy, third trimester: Secondary | ICD-10-CM

## 2022-02-28 ENCOUNTER — Ambulatory Visit (INDEPENDENT_AMBULATORY_CARE_PROVIDER_SITE_OTHER): Payer: Medicaid Other | Admitting: Obstetrics and Gynecology

## 2022-02-28 DIAGNOSIS — Z3A38 38 weeks gestation of pregnancy: Secondary | ICD-10-CM

## 2022-02-28 DIAGNOSIS — Z3403 Encounter for supervision of normal first pregnancy, third trimester: Secondary | ICD-10-CM

## 2022-02-28 NOTE — Patient Instructions (Signed)

## 2022-02-28 NOTE — Progress Notes (Signed)
   PRENATAL VISIT NOTE  Subjective:  Kaitlyn Peters is a 26 y.o. G1P0 at [redacted]w[redacted]d being seen today for ongoing prenatal care.  She is currently monitored for the following issues for this low-risk pregnancy and has Supervision of normal pregnancy and PCOS (polycystic ovarian syndrome) on their problem list.  Patient reports no complaints.  Contractions: Irritability. Vag. Bleeding: None.  Movement: Present. Denies leaking of fluid.   The following portions of the patient's history were reviewed and updated as appropriate: allergies, current medications, past family history, past medical history, past social history, past surgical history and problem list.   Objective:   Vitals:   02/28/22 1328  BP: 125/81  Pulse: (!) 106  Weight: 180 lb (81.6 kg)    Fetal Status: Fetal Heart Rate (bpm): 151 Fundal Height: 38 cm Movement: Present  Presentation: Undeterminable  General:  Alert, oriented and cooperative. Patient is in no acute distress.  Skin: Skin is warm and dry. No rash noted.   Cardiovascular: Normal heart rate noted  Respiratory: Normal respiratory effort, no problems with respiration noted  Abdomen: Soft, gravid, appropriate for gestational age.  Pain/Pressure: Present     Pelvic: Cervical exam performed in the presence of a chaperone Dilation: 1 Effacement (%): 70 Station: -3  Extremities: Normal range of motion.  Edema: Trace  Mental Status: Normal mood and affect. Normal behavior. Normal judgment and thought content.   Assessment and Plan:  Pregnancy: G1P0 at [redacted]w[redacted]d 1. Encounter for supervision of normal first pregnancy in third trimester  - GBS negative  - Doing great.   Preterm labor symptoms and general obstetric precautions including but not limited to vaginal bleeding, contractions, leaking of fluid and fetal movement were reviewed in detail with the patient. Please refer to After Visit Summary for other counseling recommendations.   No follow-ups on file.  Future  Appointments  Date Time Provider Department Center  03/06/2022  1:50 PM Lesly Dukes, MD [facility]  03/14/2022  1:30 PM Donette Larry, CNM [facility]    Venia Carbon, NP

## 2022-03-04 ENCOUNTER — Inpatient Hospital Stay (HOSPITAL_COMMUNITY)
Admission: AD | Admit: 2022-03-04 | Discharge: 2022-03-04 | Disposition: A | Discharge status: Home / Self Care | Payer: Medicaid Other | Attending: Obstetrics and Gynecology | Admitting: Obstetrics and Gynecology

## 2022-03-04 ENCOUNTER — Encounter (HOSPITAL_COMMUNITY): Payer: Self-pay | Admitting: Obstetrics and Gynecology

## 2022-03-04 ENCOUNTER — Other Ambulatory Visit: Payer: Self-pay

## 2022-03-04 DIAGNOSIS — Z3A38 38 weeks gestation of pregnancy: Secondary | ICD-10-CM

## 2022-03-04 DIAGNOSIS — M549 Dorsalgia, unspecified: Secondary | ICD-10-CM | POA: Diagnosis not present

## 2022-03-04 DIAGNOSIS — O99891 Other specified diseases and conditions complicating pregnancy: Secondary | ICD-10-CM

## 2022-03-04 DIAGNOSIS — N764 Abscess of vulva: Secondary | ICD-10-CM

## 2022-03-04 DIAGNOSIS — Z3689 Encounter for other specified antenatal screening: Secondary | ICD-10-CM | POA: Diagnosis not present

## 2022-03-04 DIAGNOSIS — R109 Unspecified abdominal pain: Secondary | ICD-10-CM | POA: Insufficient documentation

## 2022-03-04 DIAGNOSIS — O26893 Other specified pregnancy related conditions, third trimester: Secondary | ICD-10-CM | POA: Insufficient documentation

## 2022-03-04 LAB — URINALYSIS, ROUTINE W REFLEX MICROSCOPIC
Bilirubin Urine: NEGATIVE
Glucose, UA: NEGATIVE mg/dL
Ketones, ur: NEGATIVE mg/dL
Nitrite: NEGATIVE
Protein, ur: NEGATIVE mg/dL
Specific Gravity, Urine: 1.004 — ABNORMAL LOW (ref 1.005–1.030)
pH: 6 (ref 5.0–8.0)

## 2022-03-04 MED ORDER — CYCLOBENZAPRINE HCL 5 MG PO TABS
10.0000 mg | ORAL_TABLET | Freq: Three times a day (TID) | ORAL | Status: DC | PRN
  Administered 2022-03-04: 10 mg via ORAL
  Filled 2022-03-04: qty 2

## 2022-03-04 MED ORDER — LIDOCAINE HCL (PF) 1 % IJ SOLN: 30.0000 mL | Freq: Once | INTRAMUSCULAR | Status: DC

## 2022-03-04 MED ORDER — LIDOCAINE HCL (PF) 1 % IJ SOLN
INTRAMUSCULAR | Status: AC
  Administered 2022-03-04: 5 mL via INTRADERMAL
  Filled 2022-03-04: qty 5

## 2022-03-04 MED ORDER — LIDOCAINE HCL (PF) 1 % IJ SOLN: 5.0000 mL | Freq: Once | INTRAMUSCULAR | Status: AC

## 2022-03-04 MED ORDER — CEFADROXIL 500 MG PO CAPS: 500.0000 mg | ORAL_CAPSULE | Freq: Two times a day (BID) | ORAL | 0 refills | Status: DC

## 2022-03-04 MED ORDER — TRIPLE ANTIBIOTIC 5-400-5000 EX OINT: TOPICAL_OINTMENT | Freq: Four times a day (QID) | CUTANEOUS | 0 refills | Status: DC

## 2022-03-04 NOTE — MAU Note (Addendum)
..  Kaitlyn Peters is a 26 y.o. at [redacted]w[redacted]d here in MAU reporting: abdominal and back pain that has been constant since 1200 yesterday. Doesn't resolve with rest. She is rating 8/10. She also has a boil between her thigh and vagina. Rates it 8/10 when walking.    Onset of complaint: 03/03/2022 Pain score: 8/10 Vitals:   03/04/22 1446  BP: 121/79  Pulse: 92  Resp: 20  Temp: 98.4 F (36.9 C)     FHT:142 Lab orders placed from triage: UA

## 2022-03-04 NOTE — MAU Provider Note (Signed)
History     CSN: [id number]  Arrival date and time: 03/04/22 1429   Event Date/Time   First Provider Initiated Contact with Patient 03/04/22 1510      Chief Complaint  Patient presents with   Abdominal Pain   Back Pain   25 y.o. G1 @38 .6 wks presenting with abdominal pain, LBP, and boil. Abd and back pain started yesterday. Describe as constant in her lower back wrapping around to her abdomen. Rates pain 8/10. Has not treated it. States the genital boil started 3 days ago. She has tried baths and warm compresses but reports worsening tenderness. Rates 8/10. Reports hx of boils when she was heavier, mostly under her arm pits and thighs. Denies VB, LOF, ctx. Reports good FM.     OB History     Gravida  1   Para      Term      Preterm      AB      Living         SAB      IAB      Ectopic      Multiple      Live Births              Past Medical History:  Diagnosis Date   Asthma    PCOS (polycystic ovarian syndrome)     Past Surgical History:  Procedure Laterality Date   TONSILLECTOMY     WISDOM TOOTH EXTRACTION Bilateral     Family History  Problem Relation Age of Onset   Breast cancer Maternal Grandmother    Breast cancer Paternal Grandmother     Social History   Tobacco Use   Smoking status: Never   Smokeless tobacco: Never  Vaping Use   Vaping Use: Former   Substances: Nicotine  Substance Use Topics   Alcohol use: Never   Drug use: Never    Allergies: No Known Allergies  Medications Prior to Admission  Medication Sig Dispense Refill Last Dose   Prenatal Vit-Fe Fumarate-FA (MULTIVITAMIN-PRENATAL) 27-0.8 MG TABS tablet Take 1 tablet by mouth daily at 12 noon.   03/04/2022   cyclobenzaprine (FLEXERIL) 10 MG tablet Take 1 tablet (10 mg total) by mouth at bedtime as needed for muscle spasms. (Patient not taking: Reported on 02/23/2022) 20 tablet 0    hydrocortisone (ANUSOL-HC) 25 MG suppository Place 1 suppository (25 mg total) rectally 2  (two) times daily. (Patient not taking: Reported on 02/23/2022) 12 suppository 1     Review of Systems  Gastrointestinal:  Positive for abdominal pain.  Genitourinary:  Negative for dysuria, hematuria, vaginal bleeding and vaginal discharge.  Musculoskeletal:  Positive for back pain.   Physical Exam   Blood pressure 121/79, pulse 92, temperature 98.4 F (36.9 C), resp. rate 20, height 5\' 1"  (1.549 m), weight 82.8 kg, last menstrual period 05/20/2021.  Physical Exam Vitals and nursing note reviewed. Exam conducted with a chaperone present.  Constitutional:      General: She is not in acute distress.    Appearance: Normal appearance.  HENT:     Head: Normocephalic and atraumatic.  Cardiovascular:     Rate and Rhythm: Normal rate.  Pulmonary:     Effort: Pulmonary effort is normal. No respiratory distress.  Abdominal:     Palpations: Abdomen is soft.     Tenderness: There is no abdominal tenderness.  Genitourinary:   Musculoskeletal:        General: Normal range of motion.  Cervical back: Normal range of motion.  Skin:    General: Skin is warm and dry.  Neurological:     General: No focal deficit present.     Mental Status: She is alert and oriented to person, place, and time.  Psychiatric:        Mood and Affect: Mood normal.        Behavior: Behavior normal.   EFM: 135 bpm, mod variability, + accels, no decels Toco: irreg  Results for orders placed or performed during the hospital encounter of 03/04/22 (from the past 24 hour(s))  Urinalysis, Routine w reflex microscopic Urine, Clean Catch     Status: Abnormal   Collection Time: 03/04/22  4:16 PM  Result Value Ref Range   Color, Urine YELLOW YELLOW   APPearance CLOUDY (A) CLEAR   Specific Gravity, Urine 1.004 (L) 1.005 - 1.030   pH 6.0 5.0 - 8.0   Glucose, UA NEGATIVE NEGATIVE mg/dL   Hgb urine dipstick SMALL (A) NEGATIVE   Bilirubin Urine NEGATIVE NEGATIVE   Ketones, ur NEGATIVE NEGATIVE mg/dL   Protein, ur  NEGATIVE NEGATIVE mg/dL   Nitrite NEGATIVE NEGATIVE   Leukocytes,Ua LARGE (A) NEGATIVE   RBC / HPF 21-50 0 - 5 RBC/hpf   WBC, UA 21-50 0 - 5 WBC/hpf   Bacteria, UA MANY (A) NONE SEEN   Squamous Epithelial / LPF 21-50 0 - 5   MAU Course  Procedures  MDM Labs ordered and reviewed. UA with many bacteria although likely contaminated, culture ordered.  I&D by Dr. Para March, see procedure note.  Pain improved. Stable for discharge home.   Assessment and Plan   1. [redacted] weeks gestation of pregnancy   2. NST (non-stress test) reactive   3. Back pain in pregnancy   4. Vulvar abscess    Discharge home Follow up at [facility] as scheduled Rx Neosporin Rx Duricef Return precautions  Allergies as of 03/04/2022   No Known Allergies      Medication List     STOP taking these medications    hydrocortisone 25 MG suppository Commonly known as: ANUSOL-HC       TAKE these medications    cefadroxil 500 MG capsule Commonly known as: DURICEF Take 1 capsule (500 mg total) by mouth 2 (two) times daily.   cyclobenzaprine 10 MG tablet Commonly known as: FLEXERIL Take 1 tablet (10 mg total) by mouth at bedtime as needed for muscle spasms.   multivitamin-prenatal 27-0.8 MG Tabs tablet Take 1 tablet by mouth daily at 12 noon.   neomycin-bacitracin-polymyxin [phone] ointment Apply topically 4 (four) times daily.       Donette Larry, CNM 03/04/2022, 5:28 PM

## 2022-03-04 NOTE — Procedures (Signed)
I&D NOTE The indications for I&D  were reviewed - abscess.   Risks include pain, bleeding, infection, scarring and need for additional procedures were discussed. The patient stated understanding and agreed to undergo procedure today. Consent was signed, time out performed.   The patient's skin over the abscess was prepped with Betadine and numbed with 1 cc of 1% lidocaine. A stab incision was made with an 11 blade scalpel to be about 3 mm after skin tested and numb. The pus was drained - culture collected. 1% lidocaine was injected into the abscess. The back end of the qtip was used to break up loculations. Additional lidocaine was injected into the abscess.  Remaining pus was removed.  The patient tolerated the procedure well.   Post-procedure instructions and ABX were given to the patient. The patient is to call with heavy bleeding, fever greater than 100.4, foul smelling vaginal discharge or other concerns.   We will follow up wound cultures.   Milas Hock, MD Attending Obstetrician & Gynecologist, [facility] for [facility], [facility]

## 2022-03-05 LAB — CULTURE, OB URINE

## 2022-03-06 ENCOUNTER — Other Ambulatory Visit: Payer: Medicaid Other

## 2022-03-06 ENCOUNTER — Ambulatory Visit (INDEPENDENT_AMBULATORY_CARE_PROVIDER_SITE_OTHER): Payer: Medicaid Other | Admitting: Obstetrics & Gynecology

## 2022-03-06 VITALS — BP 122/87 | HR 102 | Wt 183.0 lb

## 2022-03-06 DIAGNOSIS — Z3480 Encounter for supervision of other normal pregnancy, unspecified trimester: Secondary | ICD-10-CM

## 2022-03-06 DIAGNOSIS — Z3A39 39 weeks gestation of pregnancy: Secondary | ICD-10-CM

## 2022-03-06 LAB — AEROBIC/ANAEROBIC CULTURE W GRAM STAIN (SURGICAL/DEEP WOUND)

## 2022-03-06 NOTE — Progress Notes (Signed)
   PRENATAL VISIT NOTE  Subjective:  Kaitlyn Peters is a 26 y.o. G1P0 at [redacted]w[redacted]d being seen today for ongoing prenatal care.  She is currently monitored for the following issues for this low-risk pregnancy and has Supervision of normal pregnancy and PCOS (polycystic ovarian syndrome) on their problem list.  Patient reports no complaints.  Contractions: Irritability. Vag. Bleeding: None.  Movement: Present. Denies leaking of fluid.   The following portions of the patient's history were reviewed and updated as appropriate: allergies, current medications, past family history, past medical history, past social history, past surgical history and problem list.   Objective:   Vitals:   03/06/22 1355  BP: 122/87  Pulse: (!) 102  Weight: 183 lb (83 kg)    Fetal Status: Fetal Heart Rate (bpm): 141   Movement: Present     General:  Alert, oriented and cooperative. Patient is in no acute distress.  Skin: Skin is warm and dry. No rash noted.   Cardiovascular: Normal heart rate noted  Respiratory: Normal respiratory effort, no problems with respiration noted  Abdomen: Soft, gravid, appropriate for gestational age.  Pain/Pressure: Present     Pelvic: Cervical exam performed in the presence of a chaperone 1.5/70/-2 membranes stripped        Extremities: Normal range of motion.  Edema: Mild pitting, slight indentation  Mental Status: Normal mood and affect. Normal behavior. Normal judgment and thought content.   Assessment and Plan:  Pregnancy: G1P0 at [redacted]w[redacted]d 1. Supervision of other normal pregnancy, antepartum Pt would like in office foley bulb and induction later this week.  We discussed risks and benefits to elecive induction.  2.  Boil healing well.   Term labor symptoms and general obstetric precautions including but not limited to vaginal bleeding, contractions, leaking of fluid and fetal movement were reviewed in detail with the patient. Please refer to After Visit Summary for other counseling  recommendations.   No follow-ups on file.  Future Appointments  Date Time Provider Department Center  03/14/2022  1:30 PM Donette Larry, CNM [facility]    Elsie Lincoln, MD

## 2022-03-07 ENCOUNTER — Telehealth (HOSPITAL_COMMUNITY): Payer: Self-pay | Admitting: *Deleted

## 2022-03-07 ENCOUNTER — Encounter (HOSPITAL_COMMUNITY): Payer: Self-pay | Admitting: Obstetrics and Gynecology

## 2022-03-07 ENCOUNTER — Encounter (HOSPITAL_COMMUNITY): Payer: Self-pay

## 2022-03-07 ENCOUNTER — Other Ambulatory Visit: Payer: Self-pay

## 2022-03-07 ENCOUNTER — Inpatient Hospital Stay (HOSPITAL_COMMUNITY)
Admission: AD | Admit: 2022-03-07 | Discharge: 2022-03-07 | Disposition: A | Discharge status: Home / Self Care | Payer: Medicaid Other | Source: Home / Self Care | Attending: Obstetrics and Gynecology | Admitting: Obstetrics and Gynecology

## 2022-03-07 DIAGNOSIS — Z3A39 39 weeks gestation of pregnancy: Secondary | ICD-10-CM | POA: Insufficient documentation

## 2022-03-07 DIAGNOSIS — Z3403 Encounter for supervision of normal first pregnancy, third trimester: Secondary | ICD-10-CM

## 2022-03-07 DIAGNOSIS — Z3689 Encounter for other specified antenatal screening: Secondary | ICD-10-CM

## 2022-03-07 DIAGNOSIS — O471 False labor at or after 37 completed weeks of gestation: Secondary | ICD-10-CM

## 2022-03-07 DIAGNOSIS — O479 False labor, unspecified: Secondary | ICD-10-CM

## 2022-03-07 MED ORDER — PROCHLORPERAZINE EDISYLATE 10 MG/2ML IJ SOLN
10.0000 mg | Freq: Once | INTRAMUSCULAR | Status: AC
  Administered 2022-03-07: 10 mg via INTRAVENOUS
  Filled 2022-03-07: qty 2

## 2022-03-07 MED ORDER — FENTANYL CITRATE (PF) 100 MCG/2ML IJ SOLN
50.0000 ug | Freq: Once | INTRAMUSCULAR | Status: AC
  Administered 2022-03-07: 50 ug via INTRAVENOUS
  Filled 2022-03-07: qty 2

## 2022-03-07 NOTE — Telephone Encounter (Signed)
Preadmission screen  

## 2022-03-07 NOTE — MAU Note (Signed)
.  Kaitlyn Peters is a 26 y.o. at [redacted]w[redacted]d here in MAU reporting ctxs all day that are stronger and closer. Pt was seen in MAU this am and was 2cm. Denies LOF or VB. Good FM Onset of complaint: today Pain score: 9 Vitals:   03/07/22 2019 03/07/22 2022  BP:  112/69  Pulse:  (!) 120  Resp: 20   Temp: 97.9 F (36.6 C)   SpO2: 100% 100%     FHT:153 Lab orders placed from triage:  mau labor eval

## 2022-03-07 NOTE — MAU Note (Signed)
Pt says she lost mucus plug at 2300- with bloody show  And more at 0230 [facility] - VE 1-2 cm  UC strong since 0300 Denies HSV GBS- neg

## 2022-03-08 ENCOUNTER — Inpatient Hospital Stay (HOSPITAL_COMMUNITY): Payer: Medicaid Other | Admitting: Anesthesiology

## 2022-03-08 ENCOUNTER — Inpatient Hospital Stay (HOSPITAL_COMMUNITY)
Admission: AD | Admit: 2022-03-08 | Discharge: 2022-03-10 | DRG: 805 | Disposition: A | Discharge status: Home / Self Care | Payer: Medicaid Other | Attending: Obstetrics and Gynecology | Admitting: Obstetrics and Gynecology

## 2022-03-08 ENCOUNTER — Encounter (HOSPITAL_COMMUNITY): Payer: Self-pay | Admitting: Obstetrics and Gynecology

## 2022-03-08 DIAGNOSIS — E282 Polycystic ovarian syndrome: Secondary | ICD-10-CM | POA: Diagnosis present

## 2022-03-08 DIAGNOSIS — O9081 Anemia of the puerperium: Secondary | ICD-10-CM | POA: Diagnosis not present

## 2022-03-08 DIAGNOSIS — O9952 Diseases of the respiratory system complicating childbirth: Secondary | ICD-10-CM | POA: Diagnosis present

## 2022-03-08 DIAGNOSIS — O99892 Other specified diseases and conditions complicating childbirth: Secondary | ICD-10-CM | POA: Diagnosis present

## 2022-03-08 DIAGNOSIS — O411230 Chorioamnionitis, third trimester, not applicable or unspecified: Secondary | ICD-10-CM | POA: Diagnosis present

## 2022-03-08 DIAGNOSIS — O26893 Other specified pregnancy related conditions, third trimester: Secondary | ICD-10-CM | POA: Diagnosis present

## 2022-03-08 DIAGNOSIS — O99284 Endocrine, nutritional and metabolic diseases complicating childbirth: Secondary | ICD-10-CM | POA: Diagnosis present

## 2022-03-08 DIAGNOSIS — O4202 Full-term premature rupture of membranes, onset of labor within 24 hours of rupture: Secondary | ICD-10-CM

## 2022-03-08 DIAGNOSIS — J45909 Unspecified asthma, uncomplicated: Secondary | ICD-10-CM | POA: Diagnosis present

## 2022-03-08 DIAGNOSIS — O4290 Premature rupture of membranes, unspecified as to length of time between rupture and onset of labor, unspecified weeks of gestation: Secondary | ICD-10-CM | POA: Diagnosis present

## 2022-03-08 DIAGNOSIS — R030 Elevated blood-pressure reading, without diagnosis of hypertension: Secondary | ICD-10-CM | POA: Diagnosis present

## 2022-03-08 DIAGNOSIS — Z3A39 39 weeks gestation of pregnancy: Secondary | ICD-10-CM

## 2022-03-08 DIAGNOSIS — R000 Tachycardia, unspecified: Secondary | ICD-10-CM | POA: Diagnosis present

## 2022-03-08 DIAGNOSIS — D62 Acute posthemorrhagic anemia: Secondary | ICD-10-CM | POA: Diagnosis not present

## 2022-03-08 DIAGNOSIS — O4292 Full-term premature rupture of membranes, unspecified as to length of time between rupture and onset of labor: Principal | ICD-10-CM | POA: Diagnosis present

## 2022-03-08 LAB — POCT FERN TEST: POCT Fern Test: POSITIVE

## 2022-03-08 LAB — CBC
HCT: 32 % — ABNORMAL LOW (ref 36.0–46.0)
HCT: 29.2 % — ABNORMAL LOW (ref 36.0–46.0)
Hemoglobin: 10.7 g/dL — ABNORMAL LOW (ref 12.0–15.0)
Hemoglobin: 9.6 g/dL — ABNORMAL LOW (ref 12.0–15.0)
MCH: 27.6 pg (ref 26.0–34.0)
MCH: 27.5 pg (ref 26.0–34.0)
MCHC: 33.4 g/dL (ref 30.0–36.0)
MCHC: 32.9 g/dL (ref 30.0–36.0)
MCV: 82.7 fL (ref 80.0–100.0)
MCV: 83.7 fL (ref 80.0–100.0)
Platelets: 233 10*3/uL (ref 150–400)
Platelets: 184 10*3/uL (ref 150–400)
RBC: 3.87 MIL/uL (ref 3.87–5.11)
RBC: 3.49 MIL/uL — ABNORMAL LOW (ref 3.87–5.11)
RDW: 13.9 % (ref 11.5–15.5)
RDW: 14 % (ref 11.5–15.5)
WBC: 26.9 10*3/uL — ABNORMAL HIGH (ref 4.0–10.5)
WBC: 23.7 10*3/uL — ABNORMAL HIGH (ref 4.0–10.5)
nRBC: 0 % (ref 0.0–0.2)
nRBC: 0 % (ref 0.0–0.2)

## 2022-03-08 LAB — TYPE AND SCREEN
ABO/RH(D): A POS
Antibody Screen: NEGATIVE

## 2022-03-08 LAB — PROTEIN / CREATININE RATIO, URINE
Creatinine, Urine: 227 mg/dL
Protein Creatinine Ratio: 0.23 mg/mg{Cre} — ABNORMAL HIGH (ref 0.00–0.15)
Total Protein, Urine: 52 mg/dL

## 2022-03-08 LAB — RPR: RPR Ser Ql: NONREACTIVE

## 2022-03-08 LAB — COMPREHENSIVE METABOLIC PANEL
ALT: 11 U/L (ref 0–44)
AST: 23 U/L (ref 15–41)
Albumin: 2.2 g/dL — ABNORMAL LOW (ref 3.5–5.0)
Alkaline Phosphatase: 216 U/L — ABNORMAL HIGH (ref 38–126)
Anion gap: 10 (ref 5–15)
BUN: 8 mg/dL (ref 6–20)
CO2: 20 mmol/L — ABNORMAL LOW (ref 22–32)
Calcium: 8.6 mg/dL — ABNORMAL LOW (ref 8.9–10.3)
Chloride: 97 mmol/L — ABNORMAL LOW (ref 98–111)
Creatinine, Ser: 1.15 mg/dL — ABNORMAL HIGH (ref 0.44–1.00)
GFR, Estimated: >60 mL/min (ref >60)
Glucose, Bld: 146 mg/dL — ABNORMAL HIGH (ref 70–99)
Potassium: 4.4 mmol/L (ref 3.5–5.1)
Sodium: 127 mmol/L — ABNORMAL LOW (ref 135–145)
Total Bilirubin: 0.6 mg/dL (ref 0.3–1.2)
Total Protein: 5.5 g/dL — ABNORMAL LOW (ref 6.5–8.1)

## 2022-03-08 MED ORDER — LIDOCAINE HCL (PF) 1 % IJ SOLN: 30.0000 mL | INTRAMUSCULAR | Status: DC | PRN

## 2022-03-08 MED ORDER — OXYTOCIN-SODIUM CHLORIDE 30-0.9 UT/500ML-% IV SOLN
2.5000 [IU]/h | INTRAVENOUS | Status: DC
  Filled 2022-03-08: qty 500

## 2022-03-08 MED ORDER — LIDOCAINE-EPINEPHRINE (PF) 1.5 %-1:200000 IJ SOLN
INTRAMUSCULAR | Status: DC | PRN
  Administered 2022-03-08: 5 mL via EPIDURAL

## 2022-03-08 MED ORDER — EPHEDRINE 5 MG/ML INJ: 10.0000 mg | INTRAVENOUS | Status: DC | PRN

## 2022-03-08 MED ORDER — LACTATED RINGERS IV SOLN: 500.0000 mL | Freq: Once | INTRAVENOUS | Status: DC

## 2022-03-08 MED ORDER — FENTANYL CITRATE (PF) 100 MCG/2ML IJ SOLN
50.0000 ug | Freq: Once | INTRAMUSCULAR | Status: AC
  Administered 2022-03-08: 50 ug via INTRAVENOUS
  Filled 2022-03-08: qty 2

## 2022-03-08 MED ORDER — ACETAMINOPHEN 325 MG PO TABS
650.0000 mg | ORAL_TABLET | ORAL | Status: DC | PRN
  Filled 2022-03-08: qty 2

## 2022-03-08 MED ORDER — OXYCODONE-ACETAMINOPHEN 5-325 MG PO TABS: 1.0000 | ORAL_TABLET | ORAL | Status: DC | PRN

## 2022-03-08 MED ORDER — OXYTOCIN-SODIUM CHLORIDE 30-0.9 UT/500ML-% IV SOLN
1.0000 m[IU]/min | INTRAVENOUS | Status: DC
  Administered 2022-03-08: 2 m[IU]/min via INTRAVENOUS

## 2022-03-08 MED ORDER — SOD CITRATE-CITRIC ACID 500-334 MG/5ML PO SOLN
30.0000 mL | ORAL | Status: DC | PRN
  Administered 2022-03-08: 30 mL via ORAL
  Filled 2022-03-08: qty 30

## 2022-03-08 MED ORDER — FENTANYL-BUPIVACAINE-NACL 0.5-0.125-0.9 MG/250ML-% EP SOLN
12.0000 mL/h | EPIDURAL | Status: DC | PRN
  Administered 2022-03-08: 12 mL/h via EPIDURAL
  Filled 2022-03-08: qty 250

## 2022-03-08 MED ORDER — LACTATED RINGERS IV SOLN: 500.0000 mL | INTRAVENOUS | Status: DC | PRN

## 2022-03-08 MED ORDER — CALCIUM CARBONATE ANTACID 500 MG PO CHEW
2.0000 | CHEWABLE_TABLET | Freq: Three times a day (TID) | ORAL | Status: DC | PRN
  Administered 2022-03-08 – 2022-03-10 (×5): 400 mg via ORAL
  Filled 2022-03-08 (×5): qty 2

## 2022-03-08 MED ORDER — PHENYLEPHRINE 80 MCG/ML (10ML) SYRINGE FOR IV PUSH (FOR BLOOD PRESSURE SUPPORT): 80.0000 ug | PREFILLED_SYRINGE | INTRAVENOUS | Status: DC | PRN

## 2022-03-08 MED ORDER — FENTANYL-BUPIVACAINE-NACL 0.5-0.125-0.9 MG/250ML-% EP SOLN
EPIDURAL | Status: AC
  Filled 2022-03-08: qty 250

## 2022-03-08 MED ORDER — OXYTOCIN BOLUS FROM INFUSION
333.0000 mL | Freq: Once | INTRAVENOUS | Status: AC
  Administered 2022-03-08: 333 mL via INTRAVENOUS

## 2022-03-08 MED ORDER — TERBUTALINE SULFATE 1 MG/ML IJ SOLN: 0.2500 mg | Freq: Once | INTRAMUSCULAR | Status: DC | PRN

## 2022-03-08 MED ORDER — DIPHENHYDRAMINE HCL 50 MG/ML IJ SOLN: 12.5000 mg | INTRAMUSCULAR | Status: DC | PRN

## 2022-03-08 MED ORDER — ACETAMINOPHEN 500 MG PO TABS
1000.0000 mg | ORAL_TABLET | Freq: Once | ORAL | Status: AC
  Administered 2022-03-08: 1000 mg via ORAL
  Filled 2022-03-08: qty 2

## 2022-03-08 MED ORDER — OXYCODONE-ACETAMINOPHEN 5-325 MG PO TABS: 2.0000 | ORAL_TABLET | ORAL | Status: DC | PRN

## 2022-03-08 MED ORDER — GENTAMICIN SULFATE 40 MG/ML IJ SOLN
5.0000 mg/kg | INTRAVENOUS | Status: DC
  Administered 2022-03-08 – 2022-03-09 (×2): 420 mg via INTRAVENOUS
  Filled 2022-03-08 (×3): qty 10.5

## 2022-03-08 MED ORDER — ONDANSETRON HCL 4 MG/2ML IJ SOLN
4.0000 mg | Freq: Four times a day (QID) | INTRAMUSCULAR | Status: DC | PRN
  Administered 2022-03-08 (×2): 4 mg via INTRAVENOUS
  Filled 2022-03-08 (×2): qty 2

## 2022-03-08 MED ORDER — LACTATED RINGERS IV SOLN: INTRAVENOUS | Status: DC

## 2022-03-08 MED ORDER — AMPICILLIN SODIUM 2 G IJ SOLR
2.0000 g | Freq: Four times a day (QID) | INTRAMUSCULAR | Status: DC
  Administered 2022-03-08 – 2022-03-10 (×8): 2 g via INTRAVENOUS
  Filled 2022-03-08 (×8): qty 2000

## 2022-03-08 NOTE — Progress Notes (Signed)
Kaitlyn Peters MRN: [id number]  Subjective: -Nurse reports patient with tachycardia and low grade temperature.  Strip reviewed.   Objective: BP 118/75   Pulse (!) 113   Temp 100 F (37.8 C) (Axillary)   Resp 18   Ht 5\' 1"  (1.549 m)   Wt 83.1 kg   LMP 05/20/2021   SpO2 100%   BMI 34.62 kg/m  No intake/output data recorded. No intake/output data recorded.  Fetal Monitoring: FHT: 125 bpm, Mod Var, -Decels, +15x15Accels UC: Q2-20min    Vaginal Exam: SVE:   Dilation: 9 Effacement (%): 100 Station: -1 Exam by:: [email or web address], RN Membranes:SROM Internal Monitors: None  Augmentation/Induction: Pitocin:None Cytotec: None  Assessment:  IUP at 39.3 weeks Cat I FT  Low-Grade Fever Tachycardia   Plan: -Give 1 gram tylenol now. -Dr. Ronnell Freshwater consulted and informed of patient status, evaluation, interventions, and results. Advised: *Agrees with tylenol. *Consider antibiotics at provider discretion. -Review of CBC with WBC at 23, which is an improvement since admission, but elevated.   -Gentamycin 5kg/mL and Ampicillin 2 gram ordered. -Nurse updated -Continue other mgmt as ordered   Donavan Foil, CNM Advanced Practice Provider, Center for [facility] 03/08/2022, 11:51 AM

## 2022-03-08 NOTE — Progress Notes (Signed)
Patient ID: Kaitlyn Peters, female   DOB: 1995-08-15, 26 y.o.   MRN: [id number]  Subjective: -Patient reporting some frustrations with pushing and lack of progress. Encouragement given.   Objective: BP (!) 120/54   Pulse (!) 128   Temp 99.7 F (37.6 C) (Axillary)   Resp 18   Ht 5\' 1"  (1.549 m)   Wt 83.1 kg   LMP 05/20/2021   SpO2 99%   BMI 34.62 kg/m  No intake/output data recorded. Total I/O In: -  Out: 200 [Urine:200]  Fetal Monitoring: FHT: 150 bpm, Mod Var, -Decels, +Accels UC: Palpates moderate    Vaginal Exam: SVE:   Dilation: 10 Effacement (%): 100 Station: Plus 2 Exam by:: [email or web address], CNM Membranes:SROM x 14hrs Internal Monitors: None  Augmentation/Induction: Pitocin:55mUn/min Cytotec: None  Assessment:  IUP at 39.3 weeks Cat I FT  2nd Stage Inadequate Contractions  Plan: -Reviewed patient status with team. Dr. 4m advises: *Continue to increase pitocin *Allow patient to rest *Plan to start pushing once contraction pattern improve -Patient updated on POC and agreeable.   -Continue other mgmt as ordered   Otelia Limes, CNM Advanced Practice Provider, Center for [facility] 03/08/2022, 5:49 PM

## 2022-03-08 NOTE — Progress Notes (Addendum)
S: Kaitlyn Peters is a 26 y.o. G1P0 at [redacted]w[redacted]d  who presents to MAU today complaining contractions q 3 minutes all day. She denies vaginal bleeding. She denies LOF. She reports normal fetal movement. Also endorses emesis x2.   O: BP 136/80 (BP Location: Right Arm)   Pulse (!) 120   Temp 97.9 F (36.6 C)   Resp 20   Ht 5\' 1"  (1.549 m)   Wt 83.1 kg   LMP 05/20/2021   SpO2 100%   BMI 34.61 kg/m   Cervical exam:  Dilation: 2 Effacement (%): 90 Cervical Position: Posterior Station: -2 Presentation: Vertex Exam by:: [email or web address], RN   Fetal Monitoring: Baseline: 130 bpm Variability: Moderate Accelerations: 15x15 Decelerations: None Contractions: every 3-4 mins   A: SIUP at [redacted]w[redacted]d  False labor  P: S/p fentanyl 50 x2. Compazine x1.  -Discharged  -Return precautions given. F/U routinely with OB.   [redacted]w[redacted]d, DO 03/08/2022 12:42 AM

## 2022-03-08 NOTE — Anesthesia Preprocedure Evaluation (Addendum)
Anesthesia Evaluation  Patient identified by MRN, date of birth, ID band Patient awake    Reviewed: Allergy & Precautions, NPO status , Patient's Chart, lab work & pertinent test results  Airway Mallampati: II  TM Distance: >3 FB Neck ROM: Full    Dental no notable dental hx.    Pulmonary asthma , Patient abstained from smoking.,    Pulmonary exam normal        Cardiovascular negative cardio ROS   Rhythm:Regular Rate:Normal     Neuro/Psych negative neurological ROS  negative psych ROS   GI/Hepatic negative GI ROS, Neg liver ROS,   Endo/Other  negative endocrine ROS  Renal/GU negative Renal ROS  negative genitourinary   Musculoskeletal negative musculoskeletal ROS (+)   Abdominal Normal abdominal exam  (+)   Peds  Hematology negative hematology ROS (+)   Anesthesia Other Findings   Reproductive/Obstetrics (+) Pregnancy                             Anesthesia Physical Anesthesia Plan  ASA: 2  Anesthesia Plan: Epidural   Post-op Pain Management:    Induction:   PONV Risk Score and Plan: 2 and Treatment may vary due to age or medical condition  Airway Management Planned: Natural Airway  Additional Equipment: None  Intra-op Plan:   Post-operative Plan:   Informed Consent: I have reviewed the patients History and Physical, chart, labs and discussed the procedure including the risks, benefits and alternatives for the proposed anesthesia with the patient or authorized representative who has indicated his/her understanding and acceptance.     Dental advisory given  Plan Discussed with:   Anesthesia Plan Comments: (Lab Results      Component                Value               Date                      WBC                      26.9 (H)            03/08/2022                HGB                      10.7 (L)            03/08/2022                HCT                      32.0 (L)             03/08/2022                MCV                      82.7                03/08/2022                PLT                      233  03/08/2022          )        Anesthesia Quick Evaluation

## 2022-03-08 NOTE — Progress Notes (Signed)
Labor Progress Note Kaitlyn Peters is a 26 y.o. G1P0 at [redacted]w[redacted]d presented for SOL/SROM S: Doing well. Feeling intermittent pressure. Feeling heart racing  O:  BP 113/68   Pulse (!) 111   Temp 100 F (37.8 C) (Axillary)   Resp 18   Ht 5\' 1"  (1.549 m)   Wt 83.1 kg   LMP 05/20/2021   SpO2 100%   BMI 34.62 kg/m  EFM: 130bpm/Moderate variability/ 15x15 accels/ None decels   CVE: Dilation: 9 Effacement (%): 100 Station: -1 Presentation: Vertex Exam by:: [email or web address], RN   A&P: 26 y.o. G1P0 [redacted]w[redacted]d SOL/SROM #Labor: Progressing well. Manage expectantly #Pain: Epidural #FWB: Cat 1 #GBS negative #sIAI: Maternal tachycardia with uptrending fever curve. Initiated amp and gent. #Elevated BP: Asx. Not meeting criteria yet #Anemia: likely needs PP iron oral vs IV #Seasonal Asthma: Still uses inhaler. caution with hemabate #MOF: Breast #MOC: POP #Circ:   Yes    [redacted]w[redacted]d, MD 1:09 PM

## 2022-03-08 NOTE — Progress Notes (Signed)
Patient ID: Kaitlyn Peters, female   DOB: 02-Nov-1995, 26 y.o.   MRN: [id number]  Subjective: -Patient in bed in high fowlers.  No discomforts.  Objective: BP 113/68   Pulse (!) 111   Temp 99.8 F (37.7 C) (Axillary)   Resp 18   Ht 5\' 1"  (1.549 m)   Wt 83.1 kg   LMP 05/20/2021   SpO2 100%   BMI 34.62 kg/m  No intake/output data recorded. Total I/O In: -  Out: 200 [Urine:200]  Fetal Monitoring: FHT: 145 bpm, Mod Var, -Decels, +Accels UC: Irregular    Vaginal Exam: SVE:   Dilation: 9 Effacement (%): 100 Station: -1 Exam by:: [email or web address], RN Membranes:SROM x 10hrs Internal Monitors: None  Augmentation/Induction: Pitocin:None Cytotec: None  Assessment:  IUP at 39.3 weeks Cat I FT  Active Labor  Plan: -Reviewed recent interventions including antibiotics. -Plan to get 2nd antibiotic in and nurse to hang now.  -Plan to recheck in one hour and start augmentation if necessary. -Continue other mgmt as ordered   Ronnell Freshwater, CNM Advanced Practice Provider, Center for [facility] 03/08/2022, 1:31 PM

## 2022-03-08 NOTE — MAU Note (Signed)
.  Kaitlyn Peters is a 26 y.o. at [redacted]w[redacted]d here in MAU reporting rupture of membranes about 0300  Onset of complaint: 0300 Pain score: 10 Vitals:   03/08/22 0409  Resp: 20  Temp: 98.6 F (37 C)     FHT:152 Lab orders placed from triage: mau labor eval

## 2022-03-08 NOTE — H&P (Addendum)
OBSTETRIC ADMISSION HISTORY AND PHYSICAL  Kaitlyn Peters is a 26 y.o. female G1P0 with IUP at [redacted]w[redacted]d by 8 wk [place] presenting for SOL/SROM. She reports +FMs, No LOF, no VB, no blurry vision, headaches or peripheral edema, and RUQ pain.  She plans on breast feeding. She request POPs for birth control. She received her prenatal care at  [facility]    Dating: By 8wk [place] --->  Estimated Date of Delivery: 03/12/22  Sono:    @[redacted]w[redacted]d , CWD, normal anatomy, breech presentation, posterior placental lie, 575g, 27% EFW   Prenatal History/Complications:  -I&D of [place] Cyst at 38.6 weeks (J.E)  Past Medical History: Past Medical History:  Diagnosis Date   Asthma    PCOS (polycystic ovarian syndrome)     Past Surgical History: Past Surgical History:  Procedure Laterality Date   TONSILLECTOMY     WISDOM TOOTH EXTRACTION Bilateral     Obstetrical History: OB History     Gravida  1   Para      Term      Preterm      AB      Living  0      SAB      IAB      Ectopic      Multiple      Live Births              Social History Social History   Socioeconomic History   Marital status: Significant Other    Spouse name: Not on file   Number of children: Not on file   Years of education: Not on file   Highest education level: Not on file  Occupational History   Not on file  Tobacco Use   Smoking status: Never   Smokeless tobacco: Never  Vaping Use   Vaping Use: Former   Substances: Nicotine  Substance and Sexual Activity   Alcohol use: Never   Drug use: Never   Sexual activity: Yes    Birth control/protection: None  Other Topics Concern   Not on file  Social History Narrative   Not on file   Social Determinants of Health   Financial Resource Strain: Not on file  Food Insecurity: Not on file  Transportation Needs: Not on file  Physical Activity: Not on file  Stress: Not on file  Social Connections: Not on file    Family History: Family History  Problem  Relation Age of Onset   Breast cancer Maternal Grandmother    Breast cancer Paternal Grandmother     Allergies: No Known Allergies  Medications Prior to Admission  Medication Sig Dispense Refill Last Dose   cefadroxil (DURICEF) 500 MG capsule Take 1 capsule (500 mg total) by mouth 2 (two) times daily. 14 capsule 0 03/07/2022   Prenatal Vit-Fe Fumarate-FA (MULTIVITAMIN-PRENATAL) 27-0.8 MG TABS tablet Take 1 tablet by mouth daily at 12 noon.   03/07/2022   cyclobenzaprine (FLEXERIL) 10 MG tablet Take 1 tablet (10 mg total) by mouth at bedtime as needed for muscle spasms. 20 tablet 0    neomycin-bacitracin-polymyxin (NEOSPORIN) [phone] ointment Apply topically 4 (four) times daily. 28.3 g 0      Review of Systems   All systems reviewed and negative except as stated in HPI  Blood pressure (!) 142/82, pulse (!) 103, temperature 99.4 F (37.4 C), temperature source Axillary, resp. rate 18, height 5\' 1"  (1.549 m), weight 83.1 kg, last menstrual period 05/20/2021, SpO2 100 %. General appearance: alert Lungs: clear to auscultation bilaterally  Heart: regular rate and rhythm Abdomen: soft, non-tender; bowel sounds normal Presentation: cephalic Fetal monitoring 130bpm/Moderate variability/ 15x15 accels/ None decels  Uterine activityFrequency: Every 4 minutes Dilation: 9 Effacement (%): 90 Station: -1 Exam by:: Ronnell Freshwater, RN   Prenatal labs: ABO, Rh: --/--/A POS (08/16 0426) Antibody: NEG (08/16 0426) Rubella: 4.98 (01/10 1508) RPR: NON REACTIVE (08/16 0426)  HBsAg: NON-REACTIVE (01/10 1508)  HIV: NON-REACTIVE (05/16 1023)  GBS:   negative 1 hr Glucola wnl Genetic screening  neg Anatomy US CPCs, resolved  Prenatal Transfer Tool  Maternal Diabetes: No Genetic Screening: Normal Maternal Ultrasounds/Referrals: Normal Fetal Ultrasounds or other Referrals:  Other: CPCs, resolved Maternal Substance Abuse:  No Significant Maternal Medications:  None Significant Maternal Lab  Results: Group B Strep negative  Results for orders placed or performed during the hospital encounter of 03/08/22 (from the past 24 hour(s))  POCT fern test   Collection Time: 03/08/22  4:21 AM  Result Value Ref Range   POCT Fern Test Positive = ruptured amniotic membanes   CBC   Collection Time: 03/08/22  4:26 AM  Result Value Ref Range   WBC 26.9 (H) 4.0 - 10.5 K/uL   RBC 3.87 3.87 - 5.11 MIL/uL   Hemoglobin 10.7 (L) 12.0 - 15.0 g/dL   HCT 95.6 (L) 38.7 - 56.4 %   MCV 82.7 80.0 - 100.0 fL   MCH 27.6 26.0 - 34.0 pg   MCHC 33.4 30.0 - 36.0 g/dL   RDW 33.2 95.1 - 88.4 %   Platelets 233 150 - 400 K/uL   nRBC 0.0 0.0 - 0.2 %  RPR   Collection Time: 03/08/22  4:26 AM  Result Value Ref Range   RPR Ser Ql NON REACTIVE NON REACTIVE  Type and screen [facility]   Collection Time: 03/08/22  4:26 AM  Result Value Ref Range   ABO/RH(D) A POS    Antibody Screen NEG    Sample Expiration      03/11/2022,2359 Performed at [facility], [address] [place], [place] [address]     Patient Active Problem List   Diagnosis Date Noted   Premature rupture of membranes 03/08/2022   Supervision of normal pregnancy 08/02/2021   PCOS (polycystic ovarian syndrome) 08/15/2017    Assessment/Plan:  Kaitlyn Peters is a 26 y.o. G1P0 at [redacted]w[redacted]d here for SOL/SROM.   #Labor: Progressing well. Was in MAU earlier this evening and 2cm, now 9 cm. Will expectantly manage for now.  #Pain: Epidural #FWB: Cat 1 #ID: GBS neg #MOF: Breast #MOC: POP #Circ:  Yes  #Elevated BP: Asx. Not meeting criteria yet #Anemia: likely needs PP iron oral vs IV #Seasonal Asthma: Still uses inhaler. caution with hemabate  Ilda Basset, MD  03/08/2022, 9:21 AM  Attestation of Supervision of Student:  I confirm that I have verified the information documented in the  resident's  note and that I have also personally reperformed the history, physical exam and all medical decision making activities.  I  have verified that all services and findings are accurately documented in this student's note; and I agree with management and plan as outlined in the documentation. I have also made any necessary editorial changes.  Changes noted and initialed.  Separate note placed at 0954  Cherre Robins, CNM Center for [employer], [facility] 03/08/2022 11:00 AM

## 2022-03-08 NOTE — Discharge Summary (Addendum)
Postpartum Discharge Summary  Date of Service updated***     Patient Name: Kaitlyn Peters DOB: 12-Jun-1996 MRN: [id number]  Date of admission: 03/08/2022 Delivery date:03/08/2022  Delivering provider: Gerlene Fee  Date of discharge: 03/10/2022  Admitting diagnosis: Premature rupture of membranes [O42.90] Intrauterine pregnancy: [redacted]w[redacted]d    Secondary diagnosis:  Principal Problem:   Premature rupture of membranes  Additional problems: Chorio    Discharge diagnosis: Term Pregnancy Delivered                                              Post partum procedures:{Postpartum procedures:23558} Augmentation: N/A Complications: Intrauterine Inflammation or infection (Chorioamniotis)  Hospital course: Onset of Labor With Vaginal Delivery      26y.o. yo G1P1001 at 31w3das admitted in Active Labor on 03/08/2022. Patient had an uncomplicated labor course as follows:  Membrane Rupture Time/Date: 3:00 AM ,03/08/2022   Delivery Method:Vaginal, Spontaneous  Episiotomy: None  Lacerations:  None  Patient had an uncomplicated postpartum course.  She is ambulating, tolerating a regular diet, passing flatus, and urinating well. Patient is discharged home in stable condition on 03/10/22.  Newborn Data: Birth date:03/08/2022  Birth time:10:08 PM  Gender:Female  Living status:Living  Apgars:9 ,9  Weight:3240 g   Magnesium Sulfate received: No BMZ received: No Rhophylac:No MMR:No T-DaP:{Tdap:23962} Flu: N/A Transfusion:{Transfusion received:30440034}  Physical exam  Vitals:   03/09/22 0000 03/09/22 0100 03/09/22 0500 03/09/22 2030  BP: 110/72 117/65 115/62 108/62  Pulse: 100 (!) 112 98 66  Resp: '18 18 18 17  ' Temp: 100.2 F (37.9 C) 99.2 F (37.3 C) 98.7 F (37.1 C) 97.7 F (36.5 C)  TempSrc: Oral Oral Oral Oral  SpO2: 97% 97% 99% 96%  Weight:      Height:       General: {Exam; general:21111117} Lochia: {Desc; appropriate/inappropriate:30686::"appropriate"} Uterine Fundus:  {Desc; firm/soft:30687} Incision: {Exam; incision:21111123} DVT Evaluation: {Exam; dvZPS:8864847}abs: Lab Results  Component Value Date   WBC 29.5 (H) 03/09/2022   HGB 9.0 (L) 03/09/2022   HCT 27.0 (L) 03/09/2022   MCV 83.1 03/09/2022   PLT 166 03/09/2022      Latest Ref Rng & Units 03/08/2022   10:14 AM  CMP  Glucose 70 - 99 mg/dL 146   BUN 6 - 20 mg/dL 8   Creatinine 0.44 - 1.00 mg/dL 1.15   Sodium 135 - 145 mmol/L 127   Potassium 3.5 - 5.1 mmol/L 4.4   Chloride 98 - 111 mmol/L 97   CO2 22 - 32 mmol/L 20   Calcium 8.9 - 10.3 mg/dL 8.6   Total Protein 6.5 - 8.1 g/dL 5.5   Total Bilirubin 0.3 - 1.2 mg/dL 0.6   Alkaline Phos 38 - 126 U/L 216   AST 15 - 41 U/L 23   ALT 0 - 44 U/L 11    Edinburgh Score:    03/09/2022   10:32 AM  Edinburgh Postnatal Depression Scale Screening Tool  I have been able to laugh and see the funny side of things. 0  I have looked forward with enjoyment to things. 0  I have blamed myself unnecessarily when things went wrong. 1  I have been anxious or worried for no good reason. 2  I have felt scared or panicky for no good reason. 0  Things have been getting on top of me. 1  I  have been so unhappy that I have had difficulty sleeping. 0  I have felt sad or miserable. 0  I have been so unhappy that I have been crying. 0  The thought of harming myself has occurred to me. 0  Edinburgh Postnatal Depression Scale Total 4     After visit meds:  Allergies as of 03/10/2022   No Known Allergies      Medication List     TAKE these medications    acetaminophen 500 MG tablet Commonly known as: TYLENOL Take 1,000 mg by mouth daily as needed (pain).   benzocaine-Menthol 20-0.5 % Aero Commonly known as: DERMOPLAST Apply 1 Application topically as needed for irritation (perineal discomfort).   cefadroxil 500 MG capsule Commonly known as: DURICEF Take 1 capsule (500 mg total) by mouth 2 (two) times daily.   coconut oil Oil Apply 1 Application  topically as needed.   cyclobenzaprine 10 MG tablet Commonly known as: FLEXERIL Take 1 tablet (10 mg total) by mouth at bedtime as needed for muscle spasms.   famotidine 20 MG tablet Commonly known as: PEPCID Take 20 mg by mouth daily.   ibuprofen 600 MG tablet Commonly known as: ADVIL Take 1 tablet (600 mg total) by mouth every 6 (six) hours.   multivitamin-prenatal 27-0.8 MG Tabs tablet Take 2 tablets by mouth daily at 12 noon.   neomycin-bacitracin-polymyxin [phone] ointment Apply topically 4 (four) times daily. What changed: how much to take   TUMS PO Take 4 tablets by mouth as needed (heartburn).         Discharge home in stable condition Infant Feeding: Breast Infant Disposition:home with mother Discharge instruction: per After Visit Summary and Postpartum booklet. Activity: Advance as tolerated. Pelvic rest for 6 weeks.  Diet: routine diet Future Appointments:No future appointments. Follow up Visit:  [place] [facility] [place] [facility] [place]. Schedule an appointment as soon as possible for a visit in 1 month(s).   Specialty: Obstetrics and Gynecology Why: for routine after baby visit if you have not heard from the office by the end of next week Contact information: [place], [place] [phone]                Message sent to [facility] 8/17 by Naaman Plummer Autry-Lott  Please schedule this patient for a In person postpartum visit in 4 weeks with the following provider: Any provider. Additional Postpartum F/U: none   Low risk pregnancy complicated by:  Chorio Delivery mode:  Vaginal, Spontaneous  Anticipated Birth Control:  POPs   03/10/2022 Aletha Halim, MD

## 2022-03-08 NOTE — Progress Notes (Signed)
Labor Progress Note Kaitlyn Peters is a 27 y.o. G1P0 at [redacted]w[redacted]d presented for SOL/SROM S: Doing well. Feeling intermittent pressure.   O:  BP (!) 99/54   Pulse (!) 122   Temp 99.7 F (37.6 C) (Axillary)   Resp 18   Ht 5\' 1"  (1.549 m)   Wt 83.1 kg   LMP 05/20/2021   SpO2 99%   BMI 34.62 kg/m  EFM: 130bpm/Moderate variability/ 15x15 accels/ Variable decels   CVE: Dilation: 10 Dilation Complete Date: 03/08/22 Dilation Complete Time: 1440 Effacement (%): 100 Station: Plus 2 Presentation: Vertex Exam by:: [email or web address], CNM   A&P: 26 y.o. G1P0 [redacted]w[redacted]d SOL/SROM #Labor: Progressing well. Manage expectantly #Pain: Epidural #FWB: Cat 2 #GBS negative #sIAI: Maternal tachycardia with uptrending fever curve. Initiated amp and gent through delivery. #Elevated BP: Asx. Not meeting criteria yet #Anemia: likely needs PP iron oral vs IV #Seasonal Asthma: Still uses inhaler. caution with hemabate #MOF: Breast #MOC: POP #Circ:   Yes    [redacted]w[redacted]d, MD 2:56 PM

## 2022-03-08 NOTE — Progress Notes (Addendum)
Patient ID: Kaitlyn Peters, female   DOB: 1996-05-12, 26 y.o.   MRN: [id number]  Subjective: -Care assumed of 26 y.o. G1P0 at [redacted]w[redacted]d who presents for early labor and SROM. Pregnancy and medical history significant for asthma and PCOS.  In room to meet acquaintance of patient and family.  Patient resting in bed. Reports comfort with epidural, but intermittent rectal pressure and abdominal cramping.  Denies HA, visual disturbances, and SOB.    Objective: BP 126/72   Pulse (!) 112   Temp 99.3 F (37.4 C) (Axillary)   Resp 18   Ht 5\' 1"  (1.549 m)   Wt 83.1 kg   LMP 05/20/2021   SpO2 100%   BMI 34.62 kg/m  No intake/output data recorded. No intake/output data recorded.  Fetal Monitoring: FHT: 135 bpm, Mod Var, -Decels, +Accels UC: Q4-54min    Vaginal Exam: SVE:   Dilation: 9 Effacement (%): 90 Station: -1 Exam by:: [email or web address], RN Membranes:SROM x 8 hrs Internal Monitors: None  Augmentation/Induction: Pitocin:None Cytotec: None  Assessment:  IUP at 39.3 weeks Cat I FT  Active Labor GBS Negative Elevated BP  Plan: -S/P Ronnell Freshwater bolus with improvement in HR. -Elevated bp and will collect labs. -Anticipate female infant for circumcision. -Continue other mgmt as ordered -Anticipate SVD  , CNM Advanced Practice Provider, Center for [facility] 03/08/2022, 9:54 AM

## 2022-03-08 NOTE — Progress Notes (Signed)
Pharmacy Antibiotic Note  Kaitlyn Peters is a 26 y.o. female admitted on 03/08/2022 with  intraamniotic infection .  Pharmacy has been consulted for gentamicin dosing.  Plan: Start gentamicin 5 mg/kg (420 mg) IV Q24h Note Scr is slightly elevated, so will plan to obtain gentamicin trough early if dose is continued >48h or if renal function changes.  Height: 5\' 1"  (154.9 cm) Weight: 83.1 kg (183 lb 3.2 oz) IBW/kg (Calculated) : 47.8  Temp (24hrs), Avg:99 F (37.2 C), Min:97.9 F (36.6 C), Max:100 F (37.8 C)  Recent Labs  Lab 03/08/22 0426 03/08/22 1014  WBC 26.9* 23.7*  CREATININE  --  1.15*    Estimated Creatinine Clearance: 71.8 mL/min (A) (by C-G formula based on SCr of 1.15 mg/dL (H)).    No Known Allergies  Antimicrobials this admission: Ampicillin 2g IV Q6h (8/16 >> Gentamicin 5 mg/kg IV Q24h (8/16 >>  Microbiology results: 8/12 Wound Cx (vulva abscess): rare GPC pairs, GPR 8/12 UCx: GBS negative  Thank you for allowing pharmacy to be a part of this patient's care.  10/12, PharmD, MHSA, BCPPS 03/08/2022 12:09 PM

## 2022-03-08 NOTE — Anesthesia Procedure Notes (Signed)
Epidural Patient location during procedure: OB Start time: 03/08/2022 5:30 AM End time: 03/08/2022 5:55 AM  Staffing Anesthesiologist: Atilano Median, DO Performed: anesthesiologist   Preanesthetic Checklist Completed: patient identified, IV checked, site marked, risks and benefits discussed, surgical consent, monitors and equipment checked, pre-op evaluation and timeout performed  Epidural Patient position: sitting Prep: ChloraPrep Patient monitoring: heart rate, continuous pulse ox and blood pressure Approach: midline Location: L3-L4 Injection technique: LOR saline  Needle:  Needle type: Tuohy  Needle gauge: 17 G Needle length: 9 cm Needle insertion depth: 8 cm Catheter type: closed end flexible Catheter size: 20 Guage Catheter at skin depth: 12 cm Test dose: negative and 1.5% lidocaine with Epi 1:200 K  Assessment Events: blood not aspirated, injection not painful, no injection resistance and no paresthesia  Additional Notes Patient identified. Risks/Benefits/Options discussed with patient including but not limited to bleeding, infection, nerve damage, paralysis, failed block, incomplete pain control, headache, blood pressure changes, nausea, vomiting, reactions to medications, itching and postpartum back pain. Confirmed with bedside nurse the patient's most recent platelet count. Confirmed with patient that they are not currently taking any anticoagulation, have any bleeding history or any family history of bleeding disorders. Patient expressed understanding and wished to proceed. All questions were answered. Sterile technique was used throughout the entire procedure. Please see nursing notes for vital signs. Test dose was given through epidural catheter and negative prior to continuing to dose epidural or start infusion. Warning signs of high block given to the patient including shortness of breath, tingling/numbness in hands, complete motor block, or any concerning  symptoms with instructions to call for help. Patient was given instructions on fall risk and not to get out of bed. All questions and concerns addressed with instructions to call with any issues or inadequate analgesia.    Reason for block:procedure for pain

## 2022-03-09 LAB — CBC
HCT: 27 % — ABNORMAL LOW (ref 36.0–46.0)
Hemoglobin: 9 g/dL — ABNORMAL LOW (ref 12.0–15.0)
MCH: 27.7 pg (ref 26.0–34.0)
MCHC: 33.3 g/dL (ref 30.0–36.0)
MCV: 83.1 fL (ref 80.0–100.0)
Platelets: 166 10*3/uL (ref 150–400)
RBC: 3.25 MIL/uL — ABNORMAL LOW (ref 3.87–5.11)
RDW: 14.1 % (ref 11.5–15.5)
WBC: 29.5 10*3/uL — ABNORMAL HIGH (ref 4.0–10.5)
nRBC: 0 % (ref 0.0–0.2)

## 2022-03-09 MED ORDER — DIBUCAINE (PERIANAL) 1 % EX OINT: 1.0000 | TOPICAL_OINTMENT | CUTANEOUS | Status: DC | PRN

## 2022-03-09 MED ORDER — SIMETHICONE 80 MG PO CHEW: 80.0000 mg | CHEWABLE_TABLET | ORAL | Status: DC | PRN

## 2022-03-09 MED ORDER — COCONUT OIL OIL: 1.0000 | TOPICAL_OIL | Status: DC | PRN

## 2022-03-09 MED ORDER — SODIUM CHLORIDE 0.9 % IV SOLN: INTRAVENOUS | Status: DC | PRN

## 2022-03-09 MED ORDER — ACETAMINOPHEN 325 MG PO TABS
650.0000 mg | ORAL_TABLET | ORAL | Status: DC | PRN
  Administered 2022-03-09 (×3): 650 mg via ORAL
  Filled 2022-03-09 (×3): qty 2

## 2022-03-09 MED ORDER — WITCH HAZEL-GLYCERIN EX PADS
1.0000 | MEDICATED_PAD | CUTANEOUS | Status: DC | PRN
  Administered 2022-03-10: 1 via TOPICAL

## 2022-03-09 MED ORDER — BENZOCAINE-MENTHOL 20-0.5 % EX AERO
1.0000 | INHALATION_SPRAY | CUTANEOUS | Status: DC | PRN
  Administered 2022-03-09: 1 via TOPICAL
  Filled 2022-03-09 (×2): qty 56

## 2022-03-09 MED ORDER — PRENATAL MULTIVITAMIN CH
1.0000 | ORAL_TABLET | Freq: Every day | ORAL | Status: DC
  Administered 2022-03-10: 1 via ORAL
  Filled 2022-03-09: qty 1

## 2022-03-09 MED ORDER — SENNOSIDES-DOCUSATE SODIUM 8.6-50 MG PO TABS
2.0000 | ORAL_TABLET | Freq: Every day | ORAL | Status: DC
  Administered 2022-03-09 – 2022-03-10 (×2): 2 via ORAL
  Filled 2022-03-09 (×2): qty 2

## 2022-03-09 MED ORDER — TETANUS-DIPHTH-ACELL PERTUSSIS 5-2.5-18.5 LF-MCG/0.5 IM SUSY: 0.5000 mL | PREFILLED_SYRINGE | Freq: Once | INTRAMUSCULAR | Status: DC

## 2022-03-09 MED ORDER — IBUPROFEN 600 MG PO TABS
600.0000 mg | ORAL_TABLET | Freq: Four times a day (QID) | ORAL | Status: DC
  Administered 2022-03-09 – 2022-03-10 (×6): 600 mg via ORAL
  Filled 2022-03-09 (×7): qty 1

## 2022-03-09 MED ORDER — DIPHENHYDRAMINE HCL 25 MG PO CAPS: 25.0000 mg | ORAL_CAPSULE | Freq: Four times a day (QID) | ORAL | Status: DC | PRN

## 2022-03-09 MED ORDER — ONDANSETRON HCL 4 MG PO TABS
4.0000 mg | ORAL_TABLET | ORAL | Status: DC | PRN
  Administered 2022-03-09 (×2): 4 mg via ORAL
  Filled 2022-03-09 (×2): qty 1

## 2022-03-09 MED ORDER — ONDANSETRON HCL 4 MG/2ML IJ SOLN: 4.0000 mg | INTRAMUSCULAR | Status: DC | PRN

## 2022-03-09 MED ORDER — IRON SUCROSE 20 MG/ML IV SOLN
500.0000 mg | Freq: Once | INTRAVENOUS | Status: AC
  Administered 2022-03-09: 500 mg via INTRAVENOUS
  Filled 2022-03-09: qty 500

## 2022-03-09 NOTE — Lactation Note (Signed)
This note was copied from a baby's chart. Lactation Consultation Note  Patient Name: Kaitlyn Peters [id number] Date: 03/09/2022 Reason for consult: Initial assessment;Primapara;1st time breastfeeding;Term Age:28 hours   P1: Term infant at 39+3 weeks Feeding preference: Breast  Birth parent requested latch assistance.  Support person was holding "[name]" STS when I arrived.  Taught birth parent hand expression and finger fed drops to "[name]."  Assisted to latch easily in the cross cradle hold, however, "Danice Goltz" was not interested in initiating a suck.  Gentle stimulation and breast compressions demonstrated.  Suggested we try another position and, again, latched easily bur no suck initiated in the football hold.  Reassurance given.  Demonstrated swaddling for the parents and placed "[name]" in the bassinet so parents could rest.  Reviewed breast feeding basics.  Encouraged lots of STS, breast massage and hand expression.  Suggested birth parent call her RN/LC for latch assistance as needed.  Both parents very receptive to teaching.  Support person assisting with newborn care.  RN updated.   Maternal Data Has patient been taught Hand Expression?: Yes Does the patient have breastfeeding experience prior to this delivery?: No  Feeding Mother's Current Feeding Choice: Breast Milk  LATCH Score Latch: Repeated attempts needed to sustain latch, nipple held in mouth throughout feeding, stimulation needed to elicit sucking reflex.  Audible Swallowing: None  Type of Nipple: Everted at rest and after stimulation  Comfort (Breast/Nipple): Soft / non-tender  Hold (Positioning): Assistance needed to correctly position infant at breast and maintain latch.  LATCH Score: 6   Lactation Tools Discussed/Used    Interventions Interventions: Breast feeding basics reviewed;Assisted with latch;Skin to skin;Breast massage;Hand express;Breast compression  Discharge Pump: Personal (Medela)  Consult  Status Consult Status: Follow-up Date: 03/10/22 Follow-up type: In-patient    Kordell Jafri R Corbin Hott 03/09/2022, 4:23 AM

## 2022-03-09 NOTE — Progress Notes (Addendum)
POSTPARTUM PROGRESS NOTE  Post Partum Day 1  Subjective:  Kaitlyn Peters is a 26 y.o. G1P1001 s/p SVD at [redacted]w[redacted]d.  No acute events overnight.  Pt denies problems with ambulating, voiding or po intake.  She denies nausea or vomiting.  Pain is moderately controlled.  Lochia Moderate.   Objective: Blood pressure 115/62, pulse 98, temperature 98.7 F (37.1 C), temperature source Oral, resp. rate 18, height 5\' 1"  (1.549 m), weight 83.1 kg, last menstrual period 05/20/2021, SpO2 99 %, unknown if currently breastfeeding.  Physical Exam:  General: alert, cooperative and no distress Chest: no respiratory distress Heart:regular rate, distal pulses intact Abdomen: soft, nontender,  Uterine Fundus: firm, appropriately tender Skin: warm, dry  Recent Labs    03/08/22 1014 03/09/22 0407  HGB 9.6* 9.0*  HCT 29.2* 27.0*    Assessment/Plan: Kaitlyn Peters is a 26 y.o. G1P1001 s/p SVD at [redacted]w[redacted]d   PPD#1 - Doing well Contraception: POP to patch Feeding: Breast sIAI: s/p amp and gent Seasonal Asthma Iron Deficiency Anemia: ordering IV Venofer 500 mg once. Oral iron on DC  Dispo: Plan for discharge tomorrow.   LOS: 1 day   [redacted]w[redacted]d, MD 03/09/2022, 7:05 AM   GME ATTESTATION:  I saw and evaluated the patient. I agree with the findings and the plan of care as documented in the resident's note. I have made changes to documentation as necessary.  03/11/2022, DO OB Fellow, Faculty [facility], Center for [facility] 03/09/2022, 8:58 AM

## 2022-03-09 NOTE — Anesthesia Postprocedure Evaluation (Signed)
Anesthesia Post Note  Patient: Kaitlyn Peters  Procedure(s) Performed: AN AD HOC LABOR EPIDURAL     Patient location during evaluation: Mother Baby Anesthesia Type: Epidural Level of consciousness: awake and alert Pain management: pain level controlled Vital Signs Assessment: post-procedure vital signs reviewed and stable Respiratory status: spontaneous breathing, nonlabored ventilation and respiratory function stable Cardiovascular status: stable Postop Assessment: no headache, no backache and epidural receding Anesthetic complications: no   No notable events documented.  Last Vitals:  Vitals:   03/09/22 0000 03/09/22 0100  BP: 110/72 117/65  Pulse: 100 (!) 112  Resp: 18 18  Temp: 37.9 C 37.3 C  SpO2: 97% 97%    Last Pain:  Vitals:   03/09/22 0323  TempSrc:   PainSc: 0-No pain   Pain Goal: Patients Stated Pain Goal: 0 (03/08/22 0530)                 Bethan Adamek

## 2022-03-09 NOTE — Lactation Note (Signed)
This note was copied from a baby's chart. Lactation Consultation Note  Patient Name: Kaitlyn Peters [id number]'[id number] Date: 03/09/2022 Reason for consult: Follow-up assessment;1st time breastfeeding;Primapara;Term;Maternal endocrine disorder;Other (Comment) (maternal chorioamniotis)  PCOS +breast changes Age:28 hours  LC in to visit with P1 birth parent of term baby.  Baby is at 1% weight loss with good output.  Birth parent is receiving antibiotics IV and getting an iron infusion.  Probable chorioamnionitis.  Baby has been attempting to latch to the breast.  Hand expression to finger feed to baby 2 ml.  Offered to assist with positioning and latch to the breast.  Baby positioned initially prone cross cradle.  Several attempts to sustain a deep latch.  LC switched to football hold on right breast.  Baby initially fussy and latched on and off.  Hand expressed colostrum onto nipple and baby opened widely onto breast.  Baby sucked with deep jaw extensions and pauses.  Baby relaxed and parent felt a tug no pinching.   Encouraged STS with baby as much as possible.   Encouraged latching with feeding cues, asking for help prn. Recommended double pumping AFTER baby breastfeeds on initiation setting.  Encouraged parent to ask for help with the pump when baby is finished feeding.   Feeding Mother's Current Feeding Choice: Breast Milk  LATCH Score Latch: Grasps breast easily, tongue down, lips flanged, rhythmical sucking.  Audible Swallowing: A few with stimulation  Type of Nipple: Everted at rest and after stimulation  Comfort (Breast/Nipple): Soft / non-tender  Hold (Positioning): Assistance needed to correctly position infant at breast and maintain latch.  LATCH Score: 8   Lactation Tools Discussed/Used Tools: Pump;Flanges Flange Size: 24 Breast pump type: Double-Electric Breast Pump;Manual Pump Education: Setup, frequency, and cleaning;Milk Storage Reason for Pumping: Support milk  supply/PCOS Pumping frequency: Encouraged to pump both breasts after breastfeeding  Interventions Interventions: Breast feeding basics reviewed;Assisted with latch;Skin to skin;Breast massage;Hand express;Breast compression;Adjust position;Support pillows;Position options;Expressed milk;DEBP  Discharge Pump: Personal (Medela DEBP) WIC Program: No  Consult Status Consult Status: Follow-up Date: 03/10/22 Follow-up type: In-patient    Kaitlyn Peters 03/09/2022, 3:55 PM

## 2022-03-10 ENCOUNTER — Other Ambulatory Visit: Payer: Medicaid Other

## 2022-03-10 ENCOUNTER — Telehealth: Payer: Self-pay | Admitting: *Deleted

## 2022-03-10 ENCOUNTER — Encounter: Payer: Self-pay | Admitting: Obstetrics and Gynecology

## 2022-03-10 ENCOUNTER — Encounter: Payer: Medicaid Other | Admitting: Obstetrics and Gynecology

## 2022-03-10 DIAGNOSIS — O411290 Chorioamnionitis, unspecified trimester, not applicable or unspecified: Secondary | ICD-10-CM | POA: Insufficient documentation

## 2022-03-10 MED ORDER — BENZOCAINE-MENTHOL 20-0.5 % EX AERO: 1.0000 | INHALATION_SPRAY | CUTANEOUS | Status: DC | PRN

## 2022-03-10 MED ORDER — COCONUT OIL OIL: 1.0000 | TOPICAL_OIL | 0 refills | Status: DC | PRN

## 2022-03-10 MED ORDER — IBUPROFEN 600 MG PO TABS: 600.0000 mg | ORAL_TABLET | Freq: Four times a day (QID) | ORAL | 0 refills | Status: DC

## 2022-03-10 NOTE — Discharge Instructions (Signed)
Vaginal Delivery, Care After Refer to this sheet in the next few weeks. These discharge instructions provide you with information on caring for yourself after delivery. Your caregiver may also give you specific instructions. Your treatment has been planned according to the most current medical practices available, but problems sometimes occur. Call your caregiver if you have any problems or questions after you go home. HOME CARE INSTRUCTIONS Take over-the-counter or prescription medicines only as directed by your caregiver or pharmacist. Do not drink alcohol, especially if you are breastfeeding or taking medicine to relieve pain. Do not smoke tobacco. Continue to use good perineal care. Good perineal care includes: Wiping your perineum from back to front Keeping your perineum clean. You can do sitz baths twice a day, to help keep this area clean Do not use tampons, douche or have sex until your caregiver says it is okay. Shower only and avoid sitting in submerged water, aside from sitz baths Wear a well-fitting bra that provides breast support. Eat healthy foods. Drink enough fluids to keep your urine clear or pale yellow. Eat high-fiber foods such as whole grain cereals and breads, brown rice, beans, and fresh fruits and vegetables every day. These foods may help prevent or relieve constipation. Avoid constipation with high fiber foods or medications, such as miralax or metamucil Follow your caregiver's recommendations regarding resumption of activities such as climbing stairs, driving, lifting, exercising, or traveling. Talk to your caregiver about resuming sexual activities. Resumption of sexual activities is dependent upon your risk of infection, your rate of healing, and your comfort and desire to resume sexual activity. Try to have someone help you with your household activities and your newborn for at least a few days after you leave the hospital. Rest as much as possible. Try to rest or  take a nap when your newborn is sleeping. Increase your activities gradually. Keep all of your scheduled postpartum appointments. It is very important to keep your scheduled follow-up appointments. At these appointments, your caregiver will be checking to make sure that you are healing physically and emotionally. SEEK MEDICAL CARE IF:  You are passing large clots from your vagina. Save any clots to show your caregiver. You have a foul smelling discharge from your vagina. You have trouble urinating. You are urinating frequently. You have pain when you urinate. You have a change in your bowel movements. You have increasing redness, pain, or swelling near your vaginal incision (episiotomy) or vaginal tear. You have pus draining from your episiotomy or vaginal tear. Your episiotomy or vaginal tear is separating. You have painful, hard, or reddened breasts. You have a severe headache. You have blurred vision or see spots. You feel sad or depressed. You have thoughts of hurting yourself or your newborn. You have questions about your care, the care of your newborn, or medicines. You are dizzy or light-headed. You have a rash. You have nausea or vomiting. You were breastfeeding and have not had a menstrual period within 12 weeks after you stopped breastfeeding. You are not breastfeeding and have not had a menstrual period by the 12th week after delivery. You have a fever. SEEK IMMEDIATE MEDICAL CARE IF:  You have persistent pain. You have chest pain. You have shortness of breath. You faint. You have leg pain. You have stomach pain. Your vaginal bleeding saturates two or more sanitary pads in 1 hour. MAKE SURE YOU:  Understand these instructions. Will watch your condition. Will get help right away if you are not doing well or   get worse. Document Released: 07/07/2000 Document Revised: 11/24/2013 Document Reviewed: 03/06/2012 ExitCare Patient Information 2015 [employer]. This  information is not intended to replace advice given to you by your health care provider. Make sure you discuss any questions you have with your health care provider.  Sitz Bath A sitz bath is a warm water bath taken in the sitting position. The water covers only the hips and butt (buttocks). We recommend using one that fits in the toilet, to help with ease of use and cleanliness. It may be used for either healing or cleaning purposes. Sitz baths are also used to relieve pain, itching, or muscle tightening (spasms). The water may contain medicine. Moist heat will help you heal and relax.  HOME CARE  Take 3 to 4 sitz baths a day. Fill the bathtub half-full with warm water. Sit in the water and open the drain a little. Turn on the warm water to keep the tub half-full. Keep the water running constantly. Soak in the water for 15 to 20 minutes. After the sitz bath, pat the affected area dry. GET HELP RIGHT AWAY IF: You get worse instead of better. Stop the sitz baths if you get worse. MAKE SURE YOU: Understand these instructions. Will watch your condition. Will get help right away if you are not doing well or get worse. Document Released: 08/17/2004 Document Revised: 04/03/2012 Document Reviewed: 11/07/2010 ExitCare Patient Information 2015 [employer]. This information is not intended to replace advice given to you by your health care provider. Make sure you discuss any questions you have with your health care provider.   

## 2022-03-10 NOTE — Telephone Encounter (Signed)
Left patient a message to call and schedule 4 week Postpartum appointment.

## 2022-03-10 NOTE — Lactation Note (Signed)
This note was copied from a baby's chart. Lactation Consultation Note  Patient Name: Kaitlyn Peters [id number] Date: 03/10/2022 Reason for consult: Follow-up assessment;Mother's request;Difficult latch;Term;Infant weight loss;Breastfeeding assistance;Maternal endocrine disorder Age:28 hours  Infant feeding by cues 8-12x 24hr period. Birth parent noting uterine cramping in midst of feeding and infant resting for 2 to 2/12 hrs in between feed. Infant multiple stool but only one urine noted last night at 2000. Parents may have missed a urine in some of the diaper changes.   Plan 1. To feed based on cues 8-12x 24hr period. Birth parent to offer breasts and note changes of milk transfer.  2. Birth parent to supplement with eBM first followed by formula with pace bottle feeding and slow flow nipple. BF supplementation guide provided. If urine noted with next feedings, parents to go back to EBF.  3. Post pump after each feeding for 15 mins with personal pump  All questions answered at the end of the visit.   Maternal Data Has patient been taught Hand Expression?: Yes  Feeding Mother's Current Feeding Choice: Breast Milk  LATCH Score Latch: Repeated attempts needed to sustain latch, nipple held in mouth throughout feeding, stimulation needed to elicit sucking reflex.  Audible Swallowing: None  Type of Nipple: Everted at rest and after stimulation  Comfort (Breast/Nipple): Filling, red/small blisters or bruises, mild/mod discomfort  Hold (Positioning): Assistance needed to correctly position infant at breast and maintain latch.  LATCH Score: 5   Lactation Tools Discussed/Used Tools: Pump;Flanges Flange Size: 24 Breast pump type: Manual Pump Education: Setup, frequency, and cleaning;Milk Storage Reason for Pumping: elongate nipple Pumping frequency: pre pump 5-6x before latching  Interventions Interventions: Breast feeding basics reviewed;Assisted with latch;Skin to skin;Breast  massage;Hand express;Pre-pump if needed;Breast compression;Adjust position;Support pillows;Expressed milk;Position options;Comfort gels;Hand pump;DEBP;Education;Pace feeding;LC Psychologist, educational;Infant Driven Feeding Algorithm education  Discharge Discharge Education: Engorgement and breast care;Warning signs for feeding baby;Outpatient recommendation;Outpatient Epic message sent Pump: Manual;Personal;DEBP  Consult Status Consult Status: Complete Date: 03/10/22    Amaree Loisel  Nicholson-Springer 03/10/2022, 4:29 PM

## 2022-03-10 NOTE — Progress Notes (Signed)
Circumcision Consent   Discussed with mom at bedside about circumcision.    Circumcision is a surgery that removes the skin that covers the tip of the penis, called the "foreskin." Circumcision is usually done when a boy is between 1 and 10 days old, sometimes up to 3-4 weeks old.   The most common reasons boys are circumcised include for cultural/religious beliefs or for parental preference (potentially easier to clean, so baby looks like daddy, etc).   There may be some medical benefits for circumcision:    Circumcised boys seem to have slightly lower rates of: ? Urinary tract infections (per the American Academy of Pediatrics an uncircumcised boy has a 1/100 chance of developing a UTI in the first year of life, a circumcised boy at a 07/998 chance of developing a UTI in the first year of life- a 10% reduction) ? Penis cancer (typically rare- an uncircumcised female has a 1 in 100,000 chance of developing cancer of the penis) ? Sexually transmitted infection (in endemic areas, including HIV, HPV and Herpes- circumcision does NOT protect against gonorrhea, chlamydia, trachomatis, or syphilis) ? Phimosis: a condition where that makes retraction of the foreskin over the glans impossible (0.4 per 1000 boys per year or 0.6% of boys are affected by their 15th birthday)   Boys and men who are not circumcised can reduce these extra risks by: ? Cleaning their penis well ? Using condoms during sex   What are the risks of circumcision?   As with any surgical procedure, there are risks and complications. In circumcision, complications are rare and usually minor, the most common being: ? Bleeding- risk is reduced by holding each clamp for 30 seconds prior to a cut being made, and by holding pressure after the procedure is done ? Infection- the penis is cleaned prior to the procedure, and the procedure is done under sterile technique ? Damage to the urethra or amputation of the penis   All questions  were answered and mother consented.  Circumcision Consent   Discussed with mom at bedside about circumcision.    Circumcision is a surgery that removes the skin that covers the tip of the penis, called the "foreskin." Circumcision is usually done when a boy is between 1 and 10 days old, sometimes up to 3-4 weeks old.   The most common reasons boys are circumcised include for cultural/religious beliefs or for parental preference (potentially easier to clean, so baby looks like daddy, etc).   There may be some medical benefits for circumcision:    Circumcised boys seem to have slightly lower rates of: ? Urinary tract infections (per the American Academy of Pediatrics an uncircumcised boy has a 1/100 chance of developing a UTI in the first year of life, a circumcised boy at a 07/998 chance of developing a UTI in the first year of life- a 10% reduction) ? Penis cancer (typically rare- an uncircumcised female has a 1 in 100,000 chance of developing cancer of the penis) ? Sexually transmitted infection (in endemic areas, including HIV, HPV and Herpes- circumcision does NOT protect against gonorrhea, chlamydia, trachomatis, or syphilis) ? Phimosis: a condition where that makes retraction of the foreskin over the glans impossible (0.4 per 1000 boys per year or 0.6% of boys are affected by their 15th birthday)   Boys and men who are not circumcised can reduce these extra risks by: ? Cleaning their penis well ? Using condoms during sex   What are the risks of circumcision?     As with any surgical procedure, there are risks and complications. In circumcision, complications are rare and usually minor, the most common being: ? Bleeding- risk is reduced by holding each clamp for 30 seconds prior to a cut being made, and by holding pressure after the procedure is done ? Infection- the penis is cleaned prior to the procedure, and the procedure is done under sterile technique ? Damage to the urethra or  amputation of the penis   How is circumcision done in baby boys?   The baby will be placed on a special table and the legs restrained for their safety. Numbing medication is injected into the penis, and the skin is cleansed with betadine to decrease the risk of infection.    What to expect:   The penis will look red and raw for 5-7 days as it heals. We expect scabbing around where the cut was made, as well as clear-pink fluid and some swelling of the penis right after the procedure. If your baby's circumcision starts to bleed or develops pus, please contact your pediatrician immediately.   All questions were answered and mother consented.  

## 2022-03-11 ENCOUNTER — Inpatient Hospital Stay (HOSPITAL_COMMUNITY): Admission: AD | Admit: 2022-03-11 | Payer: Medicaid Other | Source: Home / Self Care | Admitting: Family Medicine

## 2022-03-11 ENCOUNTER — Inpatient Hospital Stay (HOSPITAL_COMMUNITY): Payer: Medicaid Other

## 2022-03-14 ENCOUNTER — Encounter: Payer: Medicaid Other | Admitting: Certified Nurse Midwife

## 2022-03-15 ENCOUNTER — Telehealth: Payer: Self-pay | Admitting: *Deleted

## 2022-03-15 NOTE — Telephone Encounter (Signed)
Left patient an urgent message to call and schedule in person postpartum visit in 4 weeks from delivery on 03/08/2022.

## 2022-03-18 ENCOUNTER — Telehealth (HOSPITAL_COMMUNITY): Payer: Self-pay | Admitting: *Deleted

## 2022-03-18 NOTE — Telephone Encounter (Signed)
Voice mailbox is full.  Duffy Rhody, RN 03-18-2022 at 2:37pm

## 2022-04-13 ENCOUNTER — Encounter: Payer: Self-pay | Admitting: Obstetrics and Gynecology

## 2022-04-13 ENCOUNTER — Ambulatory Visit (INDEPENDENT_AMBULATORY_CARE_PROVIDER_SITE_OTHER): Payer: Medicaid Other | Admitting: Obstetrics and Gynecology

## 2022-04-13 MED ORDER — NORELGESTROMIN-ETH ESTRADIOL 150-35 MCG/24HR TD PTWK: 1.0000 | MEDICATED_PATCH | TRANSDERMAL | 12 refills | Status: DC

## 2022-04-13 NOTE — Progress Notes (Signed)
Post Partum Visit Note  Kaitlyn Peters is a 26 y.o. G39P1001 female who presents for a postpartum visit. She is 5 weeks postpartum following a normal spontaneous vaginal delivery.  I have fully reviewed the prenatal and intrapartum course. The delivery was at 39.3 gestational weeks.  Anesthesia: epidural. Postpartum course has been unremarkable. Baby is doing well. Baby is feeding by breast. Bleeding  Similac  . Bowel function is normal. Bladder function is normal. Patient is not sexually active. Contraception method is Ship broker weekly. Postpartum depression screening: positive.   The pregnancy intention screening data noted above was reviewed. Potential methods of contraception were discussed. The patient elected to proceed with No data recorded.   Edinburgh Postnatal Depression Scale - 04/13/22 1333       Edinburgh Postnatal Depression Scale:  In the Past 7 Days   I have been able to laugh and see the funny side of things. 0    I have looked forward with enjoyment to things. 0    I have blamed myself unnecessarily when things went wrong. 2    I have been anxious or worried for no good reason. 3    I have felt scared or panicky for no good reason. 1    Things have been getting on top of me. 2    I have been so unhappy that I have had difficulty sleeping. 1    I have felt sad or miserable. 1    I have been so unhappy that I have been crying. 1    The thought of harming myself has occurred to me. 0    Edinburgh Postnatal Depression Scale Total 11             Health Maintenance Due  Topic Date Due   COVID-19 Vaccine (1) Never done   INFLUENZA VACCINE  Never done    The following portions of the patient's history were reviewed and updated as appropriate: allergies, current medications, past family history, past medical history, past social history, past surgical history, and problem list.  Review of Systems Pertinent items are noted in HPI.  Objective:  BP 111/77    Pulse 78   Ht 5\' 1"  (1.549 m)   Wt 171 lb (77.6 kg)   BMI 32.31 kg/m    General:  alert, cooperative, and no distress   Assessment:   5 wks postpartum exam normal  Plan:   Essential components of care per ACOG recommendations:  1.  Mood and well being: Patient with negative depression screening today. Reviewed local resources for support.  - Patient tobacco use? No.   - hx of drug use? No.    2. Infant care and feeding:  -Patient currently breastmilk feeding? No.  -Social determinants of health (SDOH) reviewed in EPIC. No concerns  3. Sexuality, contraception and birth spacing - Patient does not want a pregnancy in the next year.  Desired family size is 2 children.  - Reviewed reproductive life planning. Reviewed contraceptive methods based on pt preferences and effectiveness.  Patient desired Contraceptive Patch today.   - Discussed birth spacing of 18 months  4. Sleep and fatigue -Encouraged family/partner/community support of 4 hrs of uninterrupted sleep to help with mood and fatigue  5. Physical Recovery  - Discussed patients delivery and complications. She describes her labor as good. - Patient had a Vaginal, no problems at delivery. Patient had no laceration. Perineal healing reviewed. Patient expressed understanding - Patient has urinary incontinence? No. - Patient  is safe to resume physical and sexual activity  6.  Health Maintenance - HM due items addressed Yes - Last pap smear  Diagnosis  Date Value Ref Range Status  02/16/2022   Final   - Negative for intraepithelial lesion or malignancy (NILM)   Pap smear not done at today's visit.  -Breast Cancer screening indicated? No.   7. Chronic Disease/Pregnancy Condition follow up: None  - PCP follow up  Radene Gunning, [place] [facility], [place]

## 2022-04-26 ENCOUNTER — Encounter: Payer: Self-pay | Admitting: Obstetrics and Gynecology

## 2022-04-27 ENCOUNTER — Encounter: Payer: Self-pay | Admitting: Obstetrics and Gynecology

## 2022-04-27 ENCOUNTER — Telehealth (INDEPENDENT_AMBULATORY_CARE_PROVIDER_SITE_OTHER): Payer: Medicaid Other | Admitting: Obstetrics and Gynecology

## 2022-04-27 ENCOUNTER — Telehealth: Payer: Self-pay | Admitting: Obstetrics and Gynecology

## 2022-04-27 DIAGNOSIS — F53 Postpartum depression: Secondary | ICD-10-CM | POA: Diagnosis not present

## 2022-04-27 MED ORDER — SERTRALINE HCL 50 MG PO TABS: 50.0000 mg | ORAL_TABLET | Freq: Every day | ORAL | 6 refills | Status: DC

## 2022-04-27 MED ORDER — SERTRALINE HCL 25 MG PO TABS: 25.0000 mg | ORAL_TABLET | Freq: Every day | ORAL | 0 refills | Status: DC

## 2022-04-27 NOTE — Progress Notes (Signed)
    GYNECOLOGY VIRTUAL VISIT ENCOUNTER NOTE  Provider location: [facility] [place]   Patient location: Home  I connected with Kaitlyn Peters on 04/27/22 at 10:50 AM EDT by MyChart Video Encounter and verified that I am speaking with the correct person using two identifiers.   I discussed the limitations, risks, security and privacy concerns of performing an evaluation and management service virtually and the availability of in person appointments. I also discussed with the patient that there may be a patient responsible charge related to this service. The patient expressed understanding and agreed to proceed.   History:  Kaitlyn Peters is a 26 y.o. G1P1001 female being evaluated today for PPD.   Easily irritated. Mood swings. Overwhelmed.   Bonding feels okay.    Anhedonia.       Past Medical History:  Diagnosis Date   Asthma    PCOS (polycystic ovarian syndrome)    Past Surgical History:  Procedure Laterality Date   TONSILLECTOMY     WISDOM TOOTH EXTRACTION Bilateral    The following portions of the patient's history were reviewed and updated as appropriate: allergies, current medications, past family history, past medical history, past social history, past surgical history and problem list.  Review of Systems:  Pertinent items noted in HPI and remainder of comprehensive ROS otherwise negative.  Physical Exam:   General:  Alert, oriented and cooperative. Patient appears to be in no acute distress.  Mental Status: Normal mood and affect. Normal behavior. Normal judgment and thought content.   Respiratory: Normal respiratory effort, no problems with respiration noted  Rest of physical exam deferred due to type of encounter  Labs and Imaging No results found for this or any previous visit (from the past 336 hour(s)). No results found.     Assessment and Plan:     1. Postpartum depression - We will check in on her in 2 weeks. Message sent to the  office.  - Amb ref to [facility] - sertraline (ZOLOFT) 25 MG tablet; Take 1 tablet (25 mg total) by mouth daily.  Dispense: 14 tablet; Refill: 0 - sertraline (ZOLOFT) 50 MG tablet; Take 1 tablet (50 mg total) by mouth daily.  Dispense: 30 tablet; Refill: 6       I discussed the assessment and treatment plan with the patient. The patient was provided an opportunity to ask questions and all were answered. The patient agreed with the plan and demonstrated an understanding of the instructions.   The patient was advised to call back or seek an in-person evaluation/go to the ED if the symptoms worsen or if the condition fails to improve as anticipated.  I provided 8 minutes of face-to-face time during this encounter.   Radene Gunning, MD [facility], [place]

## 2022-04-27 NOTE — Progress Notes (Signed)
Edinburgh score: 20

## 2022-06-30 DIAGNOSIS — J45909 Unspecified asthma, uncomplicated: Secondary | ICD-10-CM | POA: Insufficient documentation

## 2022-07-02 ENCOUNTER — Emergency Department (HOSPITAL_COMMUNITY): Payer: Medicaid Other

## 2022-07-02 ENCOUNTER — Emergency Department (HOSPITAL_COMMUNITY)
Admission: EM | Admit: 2022-07-02 | Discharge: 2022-07-03 | Disposition: A | Discharge status: Home / Self Care | Payer: Medicaid Other | Attending: Emergency Medicine | Admitting: Emergency Medicine

## 2022-07-02 ENCOUNTER — Other Ambulatory Visit: Payer: Self-pay

## 2022-07-02 ENCOUNTER — Encounter (HOSPITAL_COMMUNITY): Payer: Self-pay | Admitting: *Deleted

## 2022-07-02 DIAGNOSIS — R112 Nausea with vomiting, unspecified: Secondary | ICD-10-CM | POA: Insufficient documentation

## 2022-07-02 DIAGNOSIS — E876 Hypokalemia: Secondary | ICD-10-CM | POA: Insufficient documentation

## 2022-07-02 DIAGNOSIS — Z1152 Encounter for screening for COVID-19: Secondary | ICD-10-CM | POA: Diagnosis not present

## 2022-07-02 DIAGNOSIS — R1013 Epigastric pain: Secondary | ICD-10-CM | POA: Diagnosis not present

## 2022-07-02 DIAGNOSIS — R197 Diarrhea, unspecified: Secondary | ICD-10-CM | POA: Diagnosis not present

## 2022-07-02 DIAGNOSIS — R1012 Left upper quadrant pain: Secondary | ICD-10-CM | POA: Diagnosis not present

## 2022-07-02 DIAGNOSIS — R1011 Right upper quadrant pain: Secondary | ICD-10-CM | POA: Insufficient documentation

## 2022-07-02 LAB — URINALYSIS, ROUTINE W REFLEX MICROSCOPIC
Bacteria, UA: NONE SEEN
Bilirubin Urine: NEGATIVE
Glucose, UA: NEGATIVE mg/dL
Ketones, ur: NEGATIVE mg/dL
Nitrite: NEGATIVE
Protein, ur: 30 mg/dL — AB
Specific Gravity, Urine: 1.032 — ABNORMAL HIGH (ref 1.005–1.030)
pH: 5 (ref 5.0–8.0)

## 2022-07-02 LAB — CBC
HCT: 44.2 % (ref 36.0–46.0)
Hemoglobin: 15 g/dL (ref 12.0–15.0)
MCH: 29.1 pg (ref 26.0–34.0)
MCHC: 33.9 g/dL (ref 30.0–36.0)
MCV: 85.8 fL (ref 80.0–100.0)
Platelets: 319 10*3/uL (ref 150–400)
RBC: 5.15 MIL/uL — ABNORMAL HIGH (ref 3.87–5.11)
RDW: 12.8 % (ref 11.5–15.5)
WBC: 13.1 10*3/uL — ABNORMAL HIGH (ref 4.0–10.5)
nRBC: 0 % (ref 0.0–0.2)

## 2022-07-02 LAB — POC URINE PREG, ED: Preg Test, Ur: NEGATIVE

## 2022-07-02 LAB — COMPREHENSIVE METABOLIC PANEL
ALT: 25 U/L (ref 0–44)
AST: 20 U/L (ref 15–41)
Albumin: 4.6 g/dL (ref 3.5–5.0)
Alkaline Phosphatase: 103 U/L (ref 38–126)
Anion gap: 10 (ref 5–15)
BUN: 13 mg/dL (ref 6–20)
CO2: 27 mmol/L (ref 22–32)
Calcium: 9 mg/dL (ref 8.9–10.3)
Chloride: 101 mmol/L (ref 98–111)
Creatinine, Ser: 0.88 mg/dL (ref 0.44–1.00)
GFR, Estimated: >60 mL/min (ref >60)
Glucose, Bld: 98 mg/dL (ref 70–99)
Potassium: 3 mmol/L — ABNORMAL LOW (ref 3.5–5.1)
Sodium: 138 mmol/L (ref 135–145)
Total Bilirubin: 0.5 mg/dL (ref 0.3–1.2)
Total Protein: 8.4 g/dL — ABNORMAL HIGH (ref 6.5–8.1)

## 2022-07-02 LAB — PREGNANCY, URINE: Preg Test, Ur: NEGATIVE

## 2022-07-02 LAB — DIFFERENTIAL
Abs Immature Granulocytes: 0.04 10*3/uL (ref 0.00–0.07)
Basophils Absolute: 0 10*3/uL (ref 0.0–0.1)
Basophils Relative: 0 %
Eosinophils Absolute: 0.1 10*3/uL (ref 0.0–0.5)
Eosinophils Relative: 1 %
Immature Granulocytes: 0 %
Lymphocytes Relative: 15 %
Lymphs Abs: 2 10*3/uL (ref 0.7–4.0)
Monocytes Absolute: 0.6 10*3/uL (ref 0.1–1.0)
Monocytes Relative: 5 %
Neutro Abs: 10.1 10*3/uL — ABNORMAL HIGH (ref 1.7–7.7)
Neutrophils Relative %: 79 %

## 2022-07-02 LAB — RESP PANEL BY RT-PCR (RSV, FLU A&B, COVID)  RVPGX2
Influenza A by PCR: NEGATIVE
Influenza B by PCR: NEGATIVE
Resp Syncytial Virus by PCR: NEGATIVE
SARS Coronavirus 2 by RT PCR: NEGATIVE

## 2022-07-02 LAB — LIPASE, BLOOD: Lipase: 31 U/L (ref 11–51)

## 2022-07-02 MED ORDER — MORPHINE SULFATE (PF) 4 MG/ML IV SOLN
4.0000 mg | Freq: Once | INTRAVENOUS | Status: AC
  Administered 2022-07-02: 4 mg via INTRAVENOUS
  Filled 2022-07-02: qty 1

## 2022-07-02 MED ORDER — METOCLOPRAMIDE HCL 5 MG/ML IJ SOLN
10.0000 mg | Freq: Once | INTRAMUSCULAR | Status: AC
  Administered 2022-07-02: 10 mg via INTRAVENOUS
  Filled 2022-07-02: qty 2

## 2022-07-02 MED ORDER — ONDANSETRON HCL 4 MG/2ML IJ SOLN
4.0000 mg | Freq: Once | INTRAMUSCULAR | Status: AC
  Administered 2022-07-02: 4 mg via INTRAVENOUS
  Filled 2022-07-02: qty 2

## 2022-07-02 MED ORDER — POTASSIUM CHLORIDE CRYS ER 20 MEQ PO TBCR
40.0000 meq | EXTENDED_RELEASE_TABLET | Freq: Once | ORAL | Status: AC
  Administered 2022-07-02: 40 meq via ORAL
  Filled 2022-07-02: qty 2

## 2022-07-02 MED ORDER — IOHEXOL 300 MG/ML  SOLN
100.0000 mL | Freq: Once | INTRAMUSCULAR | Status: AC | PRN
  Administered 2022-07-02: 100 mL via INTRAVENOUS

## 2022-07-02 MED ORDER — FAMOTIDINE IN NACL 20-0.9 MG/50ML-% IV SOLN
20.0000 mg | Freq: Once | INTRAVENOUS | Status: AC
  Administered 2022-07-02: 20 mg via INTRAVENOUS
  Filled 2022-07-02: qty 50

## 2022-07-02 MED ORDER — LACTATED RINGERS IV BOLUS
1000.0000 mL | Freq: Once | INTRAVENOUS | Status: AC
  Administered 2022-07-02: 1000 mL via INTRAVENOUS

## 2022-07-02 NOTE — ED Triage Notes (Signed)
Pt with abd pain with N/V/D since Friday, denies any fevers at home per pt. Pt states not able to keep anything down. Denies any recent sick contacts.

## 2022-07-02 NOTE — ED Notes (Signed)
ED Provider at bedside. 

## 2022-07-02 NOTE — ED Notes (Signed)
Patient transported to CT 

## 2022-07-03 MED ORDER — LOPERAMIDE HCL 2 MG PO CAPS: 2.0000 mg | ORAL_CAPSULE | Freq: Four times a day (QID) | ORAL | 0 refills | Status: DC | PRN

## 2022-07-03 MED ORDER — DROPERIDOL 2.5 MG/ML IJ SOLN
1.2500 mg | Freq: Once | INTRAMUSCULAR | Status: AC
  Administered 2022-07-03: 1.25 mg via INTRAVENOUS
  Filled 2022-07-03: qty 2

## 2022-07-03 MED ORDER — ONDANSETRON 4 MG PO TBDP: ORAL_TABLET | ORAL | 0 refills | Status: DC

## 2022-07-03 NOTE — ED Provider Notes (Signed)
Patient signed out to me after evaluation for nausea, vomiting and diarrhea.  Patient with persistent nausea and vomiting in the ED.  CT scan shows signs of enteritis, no other acute pathology.  She still was symptomatic after Zofran, Reglan, IV fluids.  Patient given droperidol and feels improved.  Will discharge with symptomatic treatment.   Gilda Crease, MD 07/03/22 [phone]

## 2022-07-03 NOTE — ED Provider Notes (Signed)
[facility] EMERGENCY DEPARTMENT Provider Note   CSN: [id number] Arrival date & time: 07/02/22  2127     History  Chief Complaint  Patient presents with   Abdominal Pain    Kaitlyn Peters is a 26 y.o. female.  Patient is a 26 year old female with no significant past medical history presenting to the emergency department with nausea, vomiting and diarrhea.  The patient states that she started to develop epigastric abdominal pain on Friday with associated nausea, vomiting and diarrhea.  She states that she was seen by her primary doctor and given Zofran, however her symptoms have continued and she has been vomiting despite the nausea medicine.  She denies any fevers or chills, cough, congestion or runny nose.  She denies any dysuria or hematuria.  She denies any prior abdominal surgeries.  She denies any known sick contacts.  She denies any recent antibiotic use or travel.  The history is provided by the patient and the spouse.  Abdominal Pain      Home Medications Prior to Admission medications   Medication Sig Start Date End Date Taking? Authorizing Provider  acetaminophen (TYLENOL) 500 MG tablet Take 1,000 mg by mouth daily as needed (pain). Patient not taking: Reported on 04/13/2022    [provider]  benzocaine-Menthol (DERMOPLAST) 20-0.5 % AERO Apply 1 Application topically as needed for irritation (perineal discomfort). Patient not taking: Reported on 04/13/2022 03/10/22   Balaton Bing, MD  Calcium Carbonate Antacid (TUMS PO) Take 4 tablets by mouth as needed (heartburn). Patient not taking: Reported on 04/13/2022    [provider]  cefadroxil (DURICEF) 500 MG capsule Take 1 capsule (500 mg total) by mouth 2 (two) times daily. Patient not taking: Reported on 04/13/2022 03/04/22   Donette Larry, CNM  coconut oil OIL Apply 1 Application topically as needed. Patient not taking: Reported on 04/13/2022 03/10/22   [place] Bing, MD  cyclobenzaprine (FLEXERIL)  10 MG tablet Take 1 tablet (10 mg total) by mouth at bedtime as needed for muscle spasms. Patient not taking: Reported on 03/09/2022 11/22/21   Judeth Horn, NP  famotidine (PEPCID) 20 MG tablet Take 20 mg by mouth daily. Patient not taking: Reported on 04/13/2022    [provider]  ibuprofen (ADVIL) 600 MG tablet Take 1 tablet (600 mg total) by mouth every 6 (six) hours. Patient not taking: Reported on 04/13/2022 03/10/22   [place]ett Bing, MD  montelukast (SINGULAIR) 10 MG tablet Take 10 mg by mouth at bedtime.    [provider]  neomycin-bacitracin-polymyxin (NEOSPORIN) [phone] ointment Apply topically 4 (four) times daily. Patient not taking: Reported on 04/13/2022 03/04/22   Donette Larry, CNM  norelgestromin-ethinyl estradiol Burr Medico) 150-35 MCG/24HR transdermal patch Place 1 patch onto the skin once a week. 04/13/22   Milas Hock, MD  Prenatal Vit-Fe Fumarate-FA (MULTIVITAMIN-PRENATAL) 27-0.8 MG TABS tablet Take 2 tablets by mouth daily at 12 noon. Patient not taking: Reported on 04/13/2022    [provider]  sertraline (ZOLOFT) 25 MG tablet Take 1 tablet (25 mg total) by mouth daily. 04/27/22   Milas Hock, MD  sertraline (ZOLOFT) 50 MG tablet Take 1 tablet (50 mg total) by mouth daily. 04/27/22   Milas Hock, MD      Allergies    Patient has no known allergies.    Review of Systems   Review of Systems  Gastrointestinal:  Positive for abdominal pain.    Physical Exam Updated Vital Signs BP 119/75   Pulse 78   Temp 98.1 F (  36.7 C) (Oral)   Resp 18   Ht 5\' 1"  (1.549 m)   Wt 75.3 kg   LMP 05/29/2022 (Approximate)   SpO2 100%   BMI 31.37 kg/m  Physical Exam Vitals and nursing note reviewed.  Constitutional:      General: She is not in acute distress.    Appearance: She is well-developed.  HENT:     Head: Normocephalic and atraumatic.     Mouth/Throat:     Mouth: Mucous membranes are moist.     Pharynx: Oropharynx is clear.  Eyes:      Pupils: Pupils are equal, round, and reactive to light.  Cardiovascular:     Rate and Rhythm: Normal rate and regular rhythm.  Pulmonary:     Effort: Pulmonary effort is normal.     Breath sounds: Normal breath sounds.  Abdominal:     General: Abdomen is flat.     Palpations: Abdomen is soft.     Tenderness: There is abdominal tenderness in the right upper quadrant, epigastric area and left upper quadrant.  Skin:    General: Skin is warm and dry.  Neurological:     General: No focal deficit present.     Mental Status: She is alert and oriented to person, place, and time.  Psychiatric:        Mood and Affect: Mood normal.        Behavior: Behavior normal.     ED Results / Procedures / Treatments   Labs (all labs ordered are listed, but only abnormal results are displayed) Labs Reviewed  COMPREHENSIVE METABOLIC PANEL - Abnormal; Notable for the following components:      Result Value   Potassium 3.0 (*)    Total Protein 8.4 (*)    All other components within normal limits  CBC - Abnormal; Notable for the following components:   WBC 13.1 (*)    RBC 5.15 (*)    All other components within normal limits  URINALYSIS, ROUTINE W REFLEX MICROSCOPIC - Abnormal; Notable for the following components:   APPearance HAZY (*)    Specific Gravity, Urine 1.032 (*)    Hgb urine dipstick MODERATE (*)    Protein, ur 30 (*)    Leukocytes,Ua TRACE (*)    All other components within normal limits  DIFFERENTIAL - Abnormal; Notable for the following components:   Neutro Abs 10.1 (*)    All other components within normal limits  RESP PANEL BY RT-PCR (RSV, FLU A&B, COVID)  RVPGX2  LIPASE, BLOOD  PREGNANCY, URINE  POC URINE PREG, ED    EKG None  Radiology CT ABDOMEN PELVIS W CONTRAST  Result Date: 07/03/2022 CLINICAL DATA:  Acute abdominal pain. Right upper quadrant pain. EXAM: CT ABDOMEN AND PELVIS WITH CONTRAST TECHNIQUE: Multidetector CT imaging of the abdomen and pelvis was  performed using the standard protocol following bolus administration of intravenous contrast. RADIATION DOSE REDUCTION: This exam was performed according to the departmental dose-optimization program which includes automated exposure control, adjustment of the mA and/or kV according to patient size and/or use of iterative reconstruction technique. CONTRAST:  14/05/2022 OMNIPAQUE IOHEXOL 300 MG/ML  SOLN COMPARISON:  None Available. FINDINGS: Lower chest: No basilar airspace disease or pleural effusion. Hepatobiliary: The liver is normal in size and density. No focal hepatic abnormality. Gallbladder physiologically distended, no calcified stone. No biliary dilatation. Pancreas: No ductal dilatation or inflammation. Spleen: Normal in size without focal abnormality. Adrenals/Urinary Tract: Normal adrenal glands. No hydronephrosis, renal calculi or focal renal abnormality.  No perinephric edema. Urinary bladder is minimally distended, no definite bladder wall thickening for degree of distension. Stomach/Bowel: Detailed bowel assessment limited in the absence of enteric contrast. Small hiatal hernia. Unremarkable appearance of the stomach. There is mild wall thickening of the proximal jejunum in the left upper quadrant with mild mesenteric edema. The more distal small bowel is fluid-filled and prominent, but no evidence of obstruction. Appendix is normal. Fluid/liquid stool within the ascending and transverse colon. Small amount of formed stool in the more distal colon. There is no colonic inflammatory change. Vascular/Lymphatic: No acute vascular finding. Normal caliber abdominal aorta. Patent portal, splenic and mesenteric veins. No enlarged lymph nodes in the abdomen or pelvis. There are few prominent central mesenteric lymph nodes are likely reactive. Reproductive: Anteverted uterus. The ovaries are symmetric and slightly prominent in size but no dominant cyst or solid lesion. Other: No free air, free fluid, or  intra-abdominal fluid collection. Musculoskeletal: There are no acute or suspicious osseous abnormalities. IMPRESSION: 1. Mild wall thickening of the proximal jejunum in the left upper quadrant with mild mesenteric edema, suspicious for enteritis. Fluid-filled and prominent fluid-filled more distal small bowel, but no evidence of obstruction. 2. Small hiatal hernia. Electronically Signed   By: Narda Rutherford M.D.   On: 07/03/2022 00:02    Procedures Procedures    Medications Ordered in ED Medications  lactated ringers bolus 1,000 mL (1,000 mLs Intravenous New Bag/Given 07/02/22 2235)  ondansetron (ZOFRAN) injection 4 mg (4 mg Intravenous Given 07/02/22 2235)  famotidine (PEPCID) IVPB 20 mg premix (0 mg Intravenous Stopped 07/02/22 2335)  metoCLOPramide (REGLAN) injection 10 mg (10 mg Intravenous Given 07/02/22 2331)  potassium chloride SA (KLOR-CON M) CR tablet 40 mEq (40 mEq Oral Given 07/02/22 2329)  morphine (PF) 4 MG/ML injection 4 mg (4 mg Intravenous Given 07/02/22 2330)  iohexol (OMNIPAQUE) 300 MG/ML solution 100 mL (100 mLs Intravenous Contrast Given 07/02/22 2337)    ED Course/ Medical Decision Making/ A&P Clinical Course as of 07/03/22 0021  Mon Jul 03, 2022  0021 Patient signed out to Dr. Blinda Leatherwood pending reassessment. [VK]    Clinical Course User Index [VK] Rexford Maus, DO                           Medical Decision Making This patient presents to the ED with chief complaint(s) of N/V/D with no pertinent past medical history which further complicates the presenting complaint. The complaint involves an extensive differential diagnosis and also carries with it a high risk of complications and morbidity.    The differential diagnosis includes gastroenteritis, pancreatitis, hepatitis, viral syndrome, dehydration, electrolyte abnormality  Additional history obtained: Additional history obtained from spouse Records reviewed primary care records  ED Course and  Reassessment: Patient was awake alert and hemodynamically stable on arrival.  She will be started on IV fluids and given Zofran and Pepcid for symptomatic control.  She will have labs and will be closely reassessed.  Independent labs interpretation:  The following labs were independently interpreted: Mild hypokalemia otherwise within normal range  Independent visualization of imaging: Pending     Amount and/or Complexity of Data Reviewed Labs: ordered. Radiology: ordered.  Risk Prescription drug management.          Final Clinical Impression(s) / ED Diagnoses Final diagnoses:  None    Rx / DC Orders ED Discharge Orders     None         Rexford Maus,  DO 07/03/22 0021

## 2022-07-12 ENCOUNTER — Encounter: Payer: Self-pay | Admitting: Advanced Practice Midwife

## 2022-07-13 ENCOUNTER — Telehealth: Payer: Self-pay | Admitting: *Deleted

## 2022-07-13 NOTE — Telephone Encounter (Signed)
Left patient a message to call and schedule. 

## 2022-07-18 ENCOUNTER — Encounter: Payer: Self-pay | Admitting: Advanced Practice Midwife

## 2022-07-19 ENCOUNTER — Ambulatory Visit: Payer: Medicaid Other | Admitting: Obstetrics and Gynecology

## 2022-09-29 DIAGNOSIS — E559 Vitamin D deficiency, unspecified: Secondary | ICD-10-CM | POA: Insufficient documentation

## 2022-12-24 ENCOUNTER — Other Ambulatory Visit: Payer: Self-pay

## 2022-12-24 ENCOUNTER — Emergency Department (HOSPITAL_COMMUNITY): Payer: Medicaid Other

## 2022-12-24 ENCOUNTER — Emergency Department (HOSPITAL_COMMUNITY)
Admission: EM | Admit: 2022-12-24 | Discharge: 2022-12-24 | Disposition: A | Discharge status: Home / Self Care | Payer: Medicaid Other | Attending: Student | Admitting: Student

## 2022-12-24 ENCOUNTER — Encounter (HOSPITAL_COMMUNITY): Payer: Self-pay | Admitting: Emergency Medicine

## 2022-12-24 DIAGNOSIS — J45909 Unspecified asthma, uncomplicated: Secondary | ICD-10-CM | POA: Insufficient documentation

## 2022-12-24 DIAGNOSIS — F410 Panic disorder [episodic paroxysmal anxiety] without agoraphobia: Secondary | ICD-10-CM | POA: Diagnosis not present

## 2022-12-24 DIAGNOSIS — R0789 Other chest pain: Secondary | ICD-10-CM | POA: Insufficient documentation

## 2022-12-24 DIAGNOSIS — R0602 Shortness of breath: Secondary | ICD-10-CM | POA: Insufficient documentation

## 2022-12-24 LAB — TROPONIN I (HIGH SENSITIVITY)
Troponin I (High Sensitivity): <2 ng/L (ref <18)
Troponin I (High Sensitivity): <2 ng/L (ref <18)

## 2022-12-24 LAB — URINALYSIS, ROUTINE W REFLEX MICROSCOPIC
Bacteria, UA: NONE SEEN
Bilirubin Urine: NEGATIVE
Glucose, UA: NEGATIVE mg/dL
Ketones, ur: NEGATIVE mg/dL
Leukocytes,Ua: NEGATIVE
Nitrite: NEGATIVE
Protein, ur: NEGATIVE mg/dL
Specific Gravity, Urine: 1.017 (ref 1.005–1.030)
pH: 7 (ref 5.0–8.0)

## 2022-12-24 LAB — CBC
HCT: 40.3 % (ref 36.0–46.0)
Hemoglobin: 13.7 g/dL (ref 12.0–15.0)
MCH: 29.5 pg (ref 26.0–34.0)
MCHC: 34 g/dL (ref 30.0–36.0)
MCV: 86.9 fL (ref 80.0–100.0)
Platelets: 265 10*3/uL (ref 150–400)
RBC: 4.64 MIL/uL (ref 3.87–5.11)
RDW: 12.5 % (ref 11.5–15.5)
WBC: 7.7 10*3/uL (ref 4.0–10.5)
nRBC: 0 % (ref 0.0–0.2)

## 2022-12-24 LAB — BASIC METABOLIC PANEL
Anion gap: 8 (ref 5–15)
BUN: 11 mg/dL (ref 6–20)
CO2: 28 mmol/L (ref 22–32)
Calcium: 9.3 mg/dL (ref 8.9–10.3)
Chloride: 102 mmol/L (ref 98–111)
Creatinine, Ser: 0.77 mg/dL (ref 0.44–1.00)
GFR, Estimated: >60 mL/min (ref >60)
Glucose, Bld: 94 mg/dL (ref 70–99)
Potassium: 4 mmol/L (ref 3.5–5.1)
Sodium: 138 mmol/L (ref 135–145)

## 2022-12-24 LAB — PREGNANCY, URINE: Preg Test, Ur: NEGATIVE

## 2022-12-24 MED ORDER — ALPRAZOLAM 0.5 MG PO TABS
0.5000 mg | ORAL_TABLET | Freq: Once | ORAL | Status: AC
  Administered 2022-12-24: 0.5 mg via ORAL
  Filled 2022-12-24: qty 1

## 2022-12-24 MED ORDER — ALBUTEROL SULFATE HFA 108 (90 BASE) MCG/ACT IN AERS: 2.0000 | INHALATION_SPRAY | RESPIRATORY_TRACT | Status: DC | PRN

## 2022-12-24 MED ORDER — ONDANSETRON HCL 4 MG/2ML IJ SOLN
4.0000 mg | Freq: Once | INTRAMUSCULAR | Status: AC
  Administered 2022-12-24: 4 mg via INTRAVENOUS
  Filled 2022-12-24: qty 2

## 2022-12-24 MED ORDER — ALUM & MAG HYDROXIDE-SIMETH 200-200-20 MG/5ML PO SUSP
15.0000 mL | Freq: Once | ORAL | Status: DC
  Filled 2022-12-24: qty 30

## 2022-12-24 MED ORDER — FAMOTIDINE 20 MG PO TABS
20.0000 mg | ORAL_TABLET | Freq: Once | ORAL | Status: AC
  Administered 2022-12-24: 20 mg via ORAL
  Filled 2022-12-24: qty 1

## 2022-12-24 MED ORDER — ALPRAZOLAM 0.5 MG PO TABS: 0.5000 mg | ORAL_TABLET | Freq: Every evening | ORAL | 0 refills | Status: DC | PRN

## 2022-12-24 MED ORDER — SODIUM CHLORIDE 0.9 % IV BOLUS
1000.0000 mL | Freq: Once | INTRAVENOUS | Status: AC
  Administered 2022-12-24: 1000 mL via INTRAVENOUS

## 2022-12-24 MED ORDER — FAMOTIDINE 20 MG PO TABS: 20.0000 mg | ORAL_TABLET | Freq: Two times a day (BID) | ORAL | 0 refills | Status: AC

## 2022-12-24 NOTE — ED Notes (Signed)
Pt gone to Xray 

## 2022-12-24 NOTE — Discharge Instructions (Addendum)
You were seen in the emergency department for shortness of breath and chest pain. Thankfully your labs and imaging were reassuring today. I have low suspicion that this was a cardiac or pulmonary manifestation. You had improvement in symptoms here in the ER with alprazolam and famotidine so I have prescribed these medications to your pharmacy. If you have any return of symptoms or significantly worsened, please return to the ER. Otherwise, follow up with your primary care provider for further evaluation.

## 2022-12-24 NOTE — ED Provider Notes (Signed)
[facility] EMERGENCY DEPARTMENT AT [facility] Provider Note   CSN: [id number] Arrival date & time: 12/24/22  1346     History Chief Complaint  Patient presents with   Shortness of Breath   Chest Pain    Kaitlyn Peters is a 27 y.o. female.  Patient presented to the emergency department complaints of shortness of breath and chest pain.  She was advised to come to the emergency department from urgent care as they reported some abnormalities on her EKG was seen there.  No prior history of any cardiac abnormalities.  She reports that earlier today she had some chest discomfort and pain that began after she tried to pick something up off the ground and imaging forward began experience this discomfort.  Has a known history of asthma.  Denies any sick contacts at home.  Denies any syncope, lightheadedness, abdominal pain, nausea, vomiting.  Patient is about 9 months postpartum.  Does endorse currently being sexually active with no contraceptive use.   Shortness of Breath Associated symptoms: chest pain   Chest Pain Associated symptoms: shortness of breath        Home Medications Prior to Admission medications   Medication Sig Start Date End Date Taking? Authorizing Provider  acetaminophen (TYLENOL) 500 MG tablet Take 1,000 mg by mouth daily as needed (pain).   Yes [provider]  ALPRAZolam Prudy Feeler) 0.5 MG tablet Take 1 tablet (0.5 mg total) by mouth at bedtime as needed for anxiety. 12/24/22  Yes Smitty Knudsen, PA-C  Calcium Carbonate Antacid (TUMS PO) Take 4 tablets by mouth as needed (heartburn).   Yes [provider]  coconut oil OIL Apply 1 Application topically as needed. 03/10/22  Yes [place] Bing, MD  escitalopram (LEXAPRO) 5 MG tablet Take 5 mg by mouth daily. 07/14/22  Yes [provider]  famotidine (PEPCID) 20 MG tablet Take 1 tablet (20 mg total) by mouth 2 (two) times daily. 12/24/22  Yes Smitty Knudsen, PA-C  ibuprofen (ADVIL) 600 MG  tablet Take 1 tablet (600 mg total) by mouth every 6 (six) hours. 03/10/22  Yes [place] Bing, MD  montelukast (SINGULAIR) 10 MG tablet Take 10 mg by mouth at bedtime.   Yes [provider]  neomycin-bacitracin-polymyxin (NEOSPORIN) [phone] ointment Apply topically 4 (four) times daily. 03/04/22  Yes Donette Larry, CNM  phentermine (ADIPEX-P) 37.5 MG tablet Take 37.5 mg by mouth daily before breakfast. 10/13/22  Yes [provider]  VENTOLIN HFA 108 (90 Base) MCG/ACT inhaler Inhale 2 puffs into the lungs every 6 (six) hours as needed. 10/11/22  Yes [provider]      Allergies    Patient has no known allergies.    Review of Systems   Review of Systems  Respiratory:  Positive for shortness of breath.   Cardiovascular:  Positive for chest pain.  All other systems reviewed and are negative.   Physical Exam Updated Vital Signs BP 104/71   Pulse 65   Temp 97.8 F (36.6 C) (Oral)   Resp 11   Ht 5\' 1"  (1.549 m)   Wt 74.8 kg   LMP 11/21/2022 (Approximate)   SpO2 100%   Breastfeeding No   BMI 31.18 kg/m  Physical Exam Vitals and nursing note reviewed.  Constitutional:      General: She is not in acute distress.    Appearance: She is well-developed.  HENT:     Head: Normocephalic and atraumatic.  Eyes:     Conjunctiva/sclera: Conjunctivae normal.  Neck:     Thyroid: No thyromegaly.  Cardiovascular:     Rate and Rhythm: Normal rate and regular rhythm.     Heart sounds: No murmur heard. Pulmonary:     Effort: Pulmonary effort is normal. No tachypnea, accessory muscle usage or respiratory distress.     Breath sounds: No decreased breath sounds, wheezing, rhonchi or rales.  Chest:     Chest wall: No tenderness.  Abdominal:     General: Bowel sounds are normal.     Palpations: Abdomen is soft. There is no mass.     Tenderness: There is no abdominal tenderness.  Musculoskeletal:        General: No swelling.     Cervical back: Neck supple.      Right lower leg: No edema.     Left lower leg: No edema.  Skin:    General: Skin is warm and dry.     Capillary Refill: Capillary refill takes less than 2 seconds.  Neurological:     Mental Status: She is alert.  Psychiatric:        Mood and Affect: Mood normal.     ED Results / Procedures / Treatments   Labs (all labs ordered are listed, but only abnormal results are displayed) Labs Reviewed  URINALYSIS, ROUTINE W REFLEX MICROSCOPIC - Abnormal; Notable for the following components:      Result Value   Hgb urine dipstick MODERATE (*)    All other components within normal limits  BASIC METABOLIC PANEL  CBC  PREGNANCY, URINE  TROPONIN I (HIGH SENSITIVITY)  TROPONIN I (HIGH SENSITIVITY)    EKG EKG Interpretation  Date/Time:  Sunday December 24 2022 13:57:02 EDT Ventricular Rate:  70 PR Interval:  143 QRS Duration: 90 QT Interval:  370 QTC Calculation: 400 R Axis:   92 Text Interpretation: Sinus rhythm Borderline right axis deviation Confirmed by Kommor, Madison ([id number]) on 12/24/2022 2:03:45 PM  Radiology DG Chest 2 View  Result Date: 12/24/2022 CLINICAL DATA:  Chest pain EXAM: CHEST - 2 VIEW COMPARISON:  None Available. FINDINGS: The heart size and mediastinal contours are within normal limits. Both lungs are clear. The visualized skeletal structures are unremarkable. IMPRESSION: No active cardiopulmonary disease. Electronically Signed   By: Allegra Lai M.D.   On: 12/24/2022 14:31    Procedures Procedures   Medications Ordered in ED Medications  albuterol (VENTOLIN HFA) 108 (90 Base) MCG/ACT inhaler 2 puff (has no administration in time range)  alum & mag hydroxide-simeth (MAALOX/MYLANTA) 200-200-20 MG/5ML suspension 15 mL (15 mLs Oral Patient Refused/Not Given 12/24/22 1644)  sodium chloride 0.9 % bolus 1,000 mL (0 mLs Intravenous Stopped 12/24/22 1644)  ondansetron (ZOFRAN) injection 4 mg (4 mg Intravenous Given 12/24/22 1541)  famotidine (PEPCID) tablet 20 mg (20 mg Oral  Given 12/24/22 1645)  ALPRAZolam (XANAX) tablet 0.5 mg (0.5 mg Oral Given 12/24/22 1645)    ED Course/ Medical Decision Making/ A&P                           Medical Decision Making Amount and/or Complexity of Data Reviewed Labs: ordered. Radiology: ordered.  Risk OTC drugs. Prescription drug management.   This patient presents to the ED for concern of shortness of breath, chest pain.  Differential diagnosis includes pulmonary embolism, MI, panic attack, severe anxiety, acid reflux   Lab Tests:  I Ordered, and personally interpreted labs.  The pertinent results include: CBC unremarkable, BMP unremarkable, urine pregnancy negative,  troponin negative, UA without evidence of infection but moderate blood noted   Imaging Studies ordered:  I ordered imaging studies including chest x-ray I independently visualized and interpreted imaging which showed no acute cardiopulmonary process I agree with the radiologist interpretation   Medicines ordered and prescription drug management:  I ordered medication including fluids, Pepcid, Xanax, Zofran for nausea and vomiting, acid reflux, panic attack Reevaluation of the patient after these medicines showed that the patient improved I have reviewed the patients home medicines and have made adjustments as needed   Problem List / ED Course:  Patient presents to the emergency department complaints shortness of breath and chest pain.  She reports that she notices pain when she was doing chores at home.  She reports that she bent over to pick something up off the ground and began to experience some of this radiating pain into the right arm.  Was seen at the urgent care earlier today and was advised to come to the emergency department given concerns about an abnormal EKG.  Initial screening EKG performed here did not show any acute abnormality such as STEMI, arrhythmia, heart block. Workup proceeded with CBC, BMP, urine pregnancy, UA, troponin.  Will  treat patient symptomatically with fluids, Zofran.  Will reassess patient after administration of these medications to see if he has symptomatic improvement. Patient's labs largely unremarkable but a moderate amount of blood was noted on urinalysis.  Patient reports that she has repeatedly had issues with blood present in her urine for several months at this point.  Unsure of exact etiology but patient is not currently experiencing dysuria, gross hematuria, flank pain.  Patient reports some mild improvement in symptoms with fluids and nausea medication but still reporting this right-sided chest pain.  Will administer Xanax and famotidine with reevaluation of patient. On reevaluation of patient, she reports that she has had complete resolution of right-sided chest pain is no longer having any discomfort.  I suspect that there is likely an underlying anxiety/panic attack disorder component underlying all these concerns today.  She does report that she takes Lexapro at this time for her depression but denies any known significant trouble with breakthrough anxiety and felt that her anxiety was well-controlled.  Will prescribe a short course of Xanax for patient to take at home as needed for breakthrough anxiety.  Patient is not currently breast-feeding.  Encourage patient to follow-up primary care provider discussing current symptoms and presentation to the emergency department.  Discussed also benefits of therapy as patient reports she has had some episodes of therapy sessions that have felt somewhat productive for her.  At this time patient denies any suicidal ideation or self-harm thoughts.  I believe the patient is stable and safe for discharge home as she is no longer reporting any chest pain and reports that her anxiety has resolved.  Advised patient to return to the emergency department if symptoms were to acutely worsen.  Patient is agreeable to treatment plan verbalized understanding all return precautions.   All questions answered prior to patient discharge.  Final Clinical Impression(s) / ED Diagnoses Final diagnoses:  Shortness of breath  Other chest pain  Panic attack    Rx / DC Orders ED Discharge Orders          Ordered    ALPRAZolam (XANAX) 0.5 MG tablet  At bedtime PRN        12/24/22 1755    famotidine (PEPCID) 20 MG tablet  2 times daily  12/24/22 1759              Smitty Knudsen, PA-C 12/24/22 1856    Glendora Score, MD 12/25/22 2227

## 2022-12-24 NOTE — ED Triage Notes (Addendum)
Pt reports CP with SOB, radiation to right arm and back since this morning. Went to UC for eval and was sent here due to abnormal EKG. Pt has pinpoint right-sided chest pain with similar pain in corresponding location on back, sharp and stabbing nature and worse with deep inhalation. No prior cardiac hx, no PE or DVT, no oral contraceptives.

## 2023-01-09 ENCOUNTER — Ambulatory Visit: Payer: Medicaid Other | Admitting: Certified Nurse Midwife

## 2023-01-09 ENCOUNTER — Encounter: Payer: Self-pay | Admitting: Certified Nurse Midwife

## 2023-01-09 ENCOUNTER — Ambulatory Visit (INDEPENDENT_AMBULATORY_CARE_PROVIDER_SITE_OTHER): Payer: Medicaid Other

## 2023-01-09 ENCOUNTER — Other Ambulatory Visit (HOSPITAL_COMMUNITY)
Admission: RE | Admit: 2023-01-09 | Discharge: 2023-01-09 | Disposition: A | Discharge status: Home / Self Care | Payer: Medicaid Other | Source: Ambulatory Visit | Attending: Certified Nurse Midwife | Admitting: Certified Nurse Midwife

## 2023-01-09 VITALS — BP 115/78 | HR 85 | Ht 61.0 in | Wt 163.0 lb

## 2023-01-09 DIAGNOSIS — N926 Irregular menstruation, unspecified: Secondary | ICD-10-CM | POA: Diagnosis not present

## 2023-01-09 DIAGNOSIS — Z3202 Encounter for pregnancy test, result negative: Secondary | ICD-10-CM

## 2023-01-09 LAB — POCT URINE PREGNANCY: Preg Test, Ur: NEGATIVE

## 2023-01-09 MED ORDER — NORGESTIMATE-ETH ESTRADIOL 0.25-35 MG-MCG PO TABS
2.0000 | ORAL_TABLET | Freq: Every day | ORAL | 0 refills | Status: DC
Start: 2023-01-09 — End: 2023-03-21

## 2023-01-09 NOTE — Progress Notes (Signed)
GYNECOLOGY OFFICE VISIT NOTE  History:  27 y.o. G1P1001 here today for VB. Reports bleeding daily for 13 days. Bleeding requires a tampon, changing 3-4 times a day. Passes small clots at times. Had normal menstrual cycle last month. She is not using contraception. No new partner. She denies any abnormal vaginal discharge, bleeding, pelvic pain or other concerns.   Past Medical History:  Diagnosis Date   Asthma    PCOS (polycystic ovarian syndrome)     Past Surgical History:  Procedure Laterality Date   TONSILLECTOMY     WISDOM TOOTH EXTRACTION Bilateral     Family History  Problem Relation Age of Onset   Breast cancer Maternal Grandmother    Breast cancer Paternal Grandmother     Social History   Socioeconomic History   Marital status: Significant Other    Spouse name: Not on file   Number of children: Not on file   Years of education: Not on file   Highest education level: Not on file  Occupational History   Not on file  Tobacco Use   Smoking status: Never   Smokeless tobacco: Never  Vaping Use   Vaping Use: Former   Substances: Nicotine  Substance and Sexual Activity   Alcohol use: Never   Drug use: Never   Sexual activity: Yes    Birth control/protection: None  Other Topics Concern   Not on file  Social History Narrative   Not on file   Social Determinants of Health   Financial Resource Strain: Not on file  Food Insecurity: Not on file  Transportation Needs: Not on file  Physical Activity: Not on file  Stress: Not on file  Social Connections: Not on file   The following portions of the patient's history were reviewed and updated as appropriate: allergies, current medications, past family history, past medical history, past social history, past surgical history and problem list.   Health Maintenance:      Component Value Date/Time   DIAGPAP  02/16/2022 1422    - Negative for intraepithelial lesion or malignancy (NILM)   ADEQPAP  02/16/2022 1422     Satisfactory for evaluation; transformation zone component ABSENT.    Review of Systems:  Negative except noted in HPI Genito-Urinary ROS:  +Vb, +vaginal itching. No pain  Objective:  Physical Exam BP 115/78   Pulse 85   Ht 5\' 1"  (1.549 m)   Wt 163 lb (73.9 kg)   LMP 11/21/2022 (Approximate)   BMI 30.80 kg/m  CONSTITUTIONAL: Well-developed, well-nourished female in no acute distress.  HENT:  Normocephalic, atraumatic EYES: Conjunctivae and EOM are normal NECK: Normal range of motion SKIN: Skin is warm and dry NEUROLOGIC: Alert and oriented to person, place, and time PSYCHIATRIC: Normal mood and affect CARDIOVASCULAR: Normal heart rate noted RESPIRATORY: Effort and rate normal ABDOMEN: Soft, NT, no distention  PELVIC: Scant bleeding. Normal appearing external genitalia; normal appearing vaginal mucosa and cervix.  No abnormal discharge noted.  Normal uterine size, no other palpable masses, no uterine tenderness. Mild bilateral adnexal tenderness.  MUSCULOSKELETAL: Normal range of motion  Labs and Imaging Results for orders placed or performed in visit on 01/09/23 (from the past 24 hour(s))  POCT urine pregnancy     Status: Normal   Collection Time: 01/09/23 11:14 AM  Result Value Ref Range   Preg Test, Ur Negative Negative    Assessment & Plan:   1. Abnormal bleeding in menstrual cycle    Orders Placed This Encounter  Procedures  US PELVIC COMPLETE WITH TRANSVAGINAL    Standing Status:   Future    Standing Expiration Date:   01/09/2024    Order Specific Question:   Reason for Exam (SYMPTOM  OR DIAGNOSIS REQUIRED)    Answer:   irregular bleeding    Order Specific Question:   Preferred imaging location?    Answer:   [facility]   CBC   TSH   Beta hCG quant (ref lab)   POCT urine pregnancy  Aptima swab Rx Sprintec: take 2 tabs daily until bleeding stops then once daily, skip placebos and start second pack. - discussed ACHES precautions  Follow up  in 2 months   I spent 25 minutes dedicated to the care of this patient including pre-visit review of records, face to face time with the patient discussing her conditions and treatments and post visit ordering of testing.  Donette Larry, CNM 01/09/2023 12:17 PM

## 2023-01-10 ENCOUNTER — Other Ambulatory Visit: Payer: Self-pay

## 2023-01-10 LAB — CBC
Hematocrit: 40.3 % (ref 34.0–46.6)
Hemoglobin: 13.6 g/dL (ref 11.1–15.9)
MCH: 29.6 pg (ref 26.6–33.0)
MCHC: 33.7 g/dL (ref 31.5–35.7)
MCV: 88 fL (ref 79–97)
Platelets: 287 10*3/uL (ref 150–450)
RBC: 4.59 x10E6/uL (ref 3.77–5.28)
RDW: 12.5 % (ref 11.7–15.4)
WBC: 6.1 10*3/uL (ref 3.4–10.8)

## 2023-01-10 LAB — TSH: TSH: 0.419 u[IU]/mL — ABNORMAL LOW (ref 0.450–4.500)

## 2023-01-10 LAB — CERVICOVAGINAL ANCILLARY ONLY
Bacterial Vaginitis (gardnerella): NEGATIVE
Candida Glabrata: NEGATIVE
Candida Vaginitis: POSITIVE — AB
Chlamydia: NEGATIVE
Comment: NEGATIVE
Comment: NEGATIVE
Comment: NEGATIVE
Comment: NEGATIVE
Comment: NEGATIVE
Comment: NORMAL
Neisseria Gonorrhea: NEGATIVE
Trichomonas: NEGATIVE

## 2023-01-10 LAB — BETA HCG QUANT (REF LAB): hCG Quant: <1 m[IU]/mL

## 2023-01-10 MED ORDER — FLUCONAZOLE 150 MG PO TABS: 150.0000 mg | ORAL_TABLET | Freq: Once | ORAL | 1 refills | Status: AC

## 2023-01-11 LAB — T3: T3, Total: 126 ng/dL (ref 71–180)

## 2023-01-11 LAB — T4: T4, Total: 7.2 ug/dL (ref 4.5–12.0)

## 2023-01-11 LAB — SPECIMEN STATUS REPORT

## 2023-03-21 ENCOUNTER — Encounter: Payer: Self-pay | Admitting: Obstetrics and Gynecology

## 2023-03-21 ENCOUNTER — Ambulatory Visit: Payer: Medicaid Other | Admitting: Obstetrics and Gynecology

## 2023-03-21 VITALS — BP 116/82 | HR 80 | Ht 61.0 in | Wt 164.0 lb

## 2023-03-21 DIAGNOSIS — Z01419 Encounter for gynecological examination (general) (routine) without abnormal findings: Secondary | ICD-10-CM

## 2023-03-21 DIAGNOSIS — R5383 Other fatigue: Secondary | ICD-10-CM

## 2023-03-21 DIAGNOSIS — E282 Polycystic ovarian syndrome: Secondary | ICD-10-CM | POA: Diagnosis not present

## 2023-03-21 DIAGNOSIS — R3989 Other symptoms and signs involving the genitourinary system: Secondary | ICD-10-CM

## 2023-03-21 NOTE — Patient Instructions (Signed)
Myoinositol and D-Chiro inositol

## 2023-03-21 NOTE — Progress Notes (Signed)
ANNUAL EXAM Patient name: Kaitlyn Peters MRN [id number]  Date of birth: June 23, 1996 Chief Complaint:   Annual Exam  History of Present Illness:   Kaitlyn Peters is a 27 y.o. G62P1001 female being seen today for a routine annual exam.   Current concerns: Working on weight loss. Started MTF but developed diarrhea and emesis and had to go to hospital.   She has had some lower abdominal pain that is bothersome to her. It is in the middle of her abdomen.   Her bleeding has improved since doing the OCPs - she did not continue them.   Current birth control: Condoms  LMP largely regular but sometimes a week off.   Patient's last menstrual period was 03/14/2023.  Last Pap/Pap History: 01/2022. Results were: NILM w/ HRHPV not done. H/O abnormal pap: no   Health Maintenance Due  Topic Date Due   COVID-19 Vaccine (1 - 2023-24 season) Never done   INFLUENZA VACCINE  02/22/2023    Review of Systems:   Pertinent items are noted in HPI Denies any headaches, blurred vision, fatigue, shortness of breath, chest pain, abdominal pain, abnormal vaginal discharge/itching/odor/irritation, problems with periods, bowel movements, urination, or intercourse unless otherwise stated above.  Pertinent History Reviewed:  Reviewed past medical,surgical, social and family history.  Reviewed problem list, medications and allergies. Physical Assessment:   Vitals:   03/21/23 1348  BP: 116/82  Pulse: 80  Weight: 164 lb (74.4 kg)  Height: 5\' 1"  (1.549 m)  Body mass index is 30.99 kg/m.   Physical Examination:  General appearance - well appearing, and in no distress Mental status - alert, oriented to person, place, and time Psych:  She has a normal mood and affect Skin - warm and dry, normal color, no suspicious lesions noted Chest - effort normal Heart - normal rate  Breasts - breasts appear normal, no suspicious masses, no skin or nipple changes or axillary nodes Abdomen - soft, nontender, nondistended,  no masses or organomegaly Pelvic -  VULVA: normal appearing vulva with no masses, tenderness or lesions, bilateral levator tenderness VAGINA: normal appearing vagina with normal color and discharge, no lesions  CERVIX: normal appearing cervix without discharge or lesions, no CMT UTERUS: uterus is felt to be normal size, shape, consistency and nontender  ADNEXA: No adnexal masses or tenderness noted. Bladder: tender on palpation Extremities:  No swelling or varicosities noted  Chaperone present for exam  No results found for this or any previous visit (from the past 24 hour(s)).  Assessment & Plan:  Kaitlyn Peters was seen today for annual exam.  Diagnoses and all orders for this visit:  PCOS (polycystic ovarian syndrome) - Discussed myoinositiol/dchiro inositol. She will try it to see if it helps.  - Recent A1C was 5.6. PCP to recheck in 6 months.   Encounter for annual routine gynecological examination - Cervical cancer screening: Discussed screening Q3 years. Reviewed importance of annual exams and limits of pap smear. Pap with reflex HPV wnl 01/2022 - GC/CT: Discussed and recommended. Pt  declines - Gardasil: completed - Birth Control: Condoms - Breast Health: Encouraged self breast awareness/exams. Teaching provided. - Follow-up: 12 months and prn  Fatigue, unspecified type Recheck TSH given result in June -     TSH Rfx on Abnormal to Free T4 -     Cortisol  Bladder pain - Bladder is tender on exam as are levator muscles. Bladder is what reproduced the pain she has experienced however. Suspect IC but will check culture too.  -  Discussed common bladder irritants. She will adjust some of them.  - If no improvement with these measures, we discussed amb ref to PFPT -     Urine Culture       Orders Placed This Encounter  Procedures   Urine Culture   TSH Rfx on Abnormal to Free T4   Cortisol    Meds: No orders of the defined types were placed in this  encounter.   Follow-up: Return in about 1 year (around 03/20/2024).  Milas Hock, MD 03/21/2023 2:24 PM

## 2023-03-22 ENCOUNTER — Encounter: Payer: Self-pay | Admitting: Obstetrics and Gynecology

## 2023-03-22 LAB — T4F: T4,Free (Direct): 1.06 ng/dL (ref 0.82–1.77)

## 2023-03-22 LAB — CORTISOL: Cortisol: 8.6 ug/dL (ref 6.2–19.4)

## 2023-03-22 LAB — TSH RFX ON ABNORMAL TO FREE T4: TSH: 0.3 u[IU]/mL — ABNORMAL LOW (ref 0.450–4.500)

## 2023-03-23 LAB — URINE CULTURE

## 2023-03-29 ENCOUNTER — Ambulatory Visit: Payer: Medicaid Other | Admitting: Nurse Practitioner

## 2023-04-02 IMAGING — US US MFM OB DETAIL+14 WK
1 series · 13 of 28 positions shown · non-contrast
Comparison: none

[Series 1: us mfm ob detail+14 wk · 132 acquisitions, 13 frames shown]
[im 5/132]
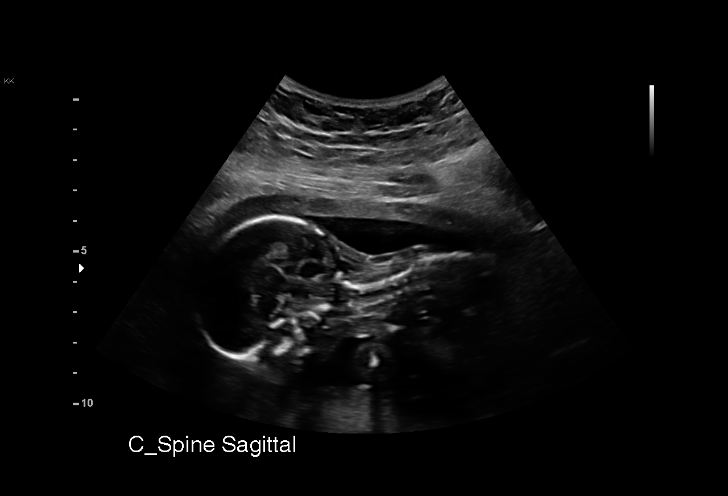
[im 15/132]
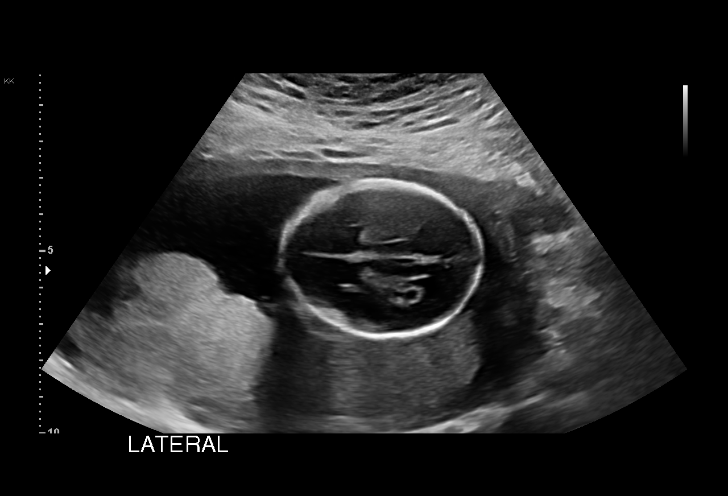
[im 25/132]
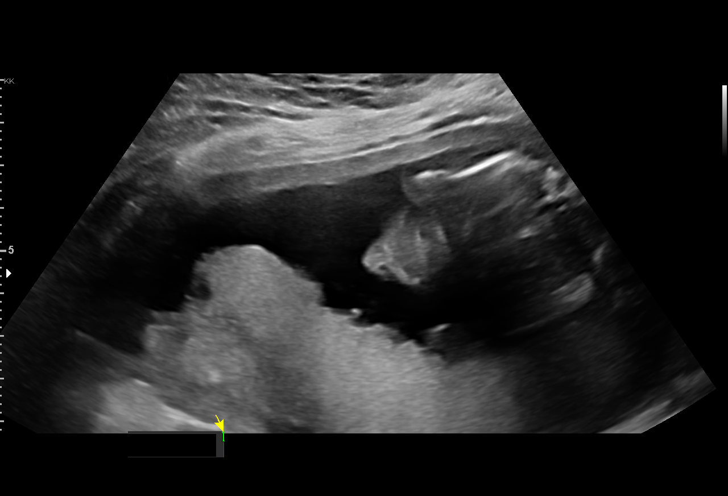
[im 34/132]
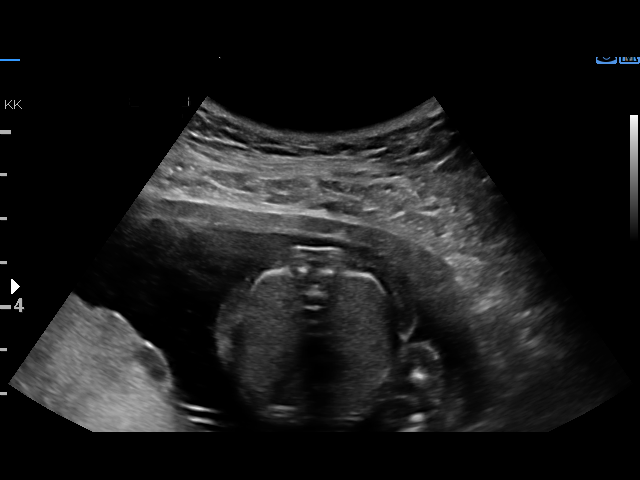
[im 44/132]
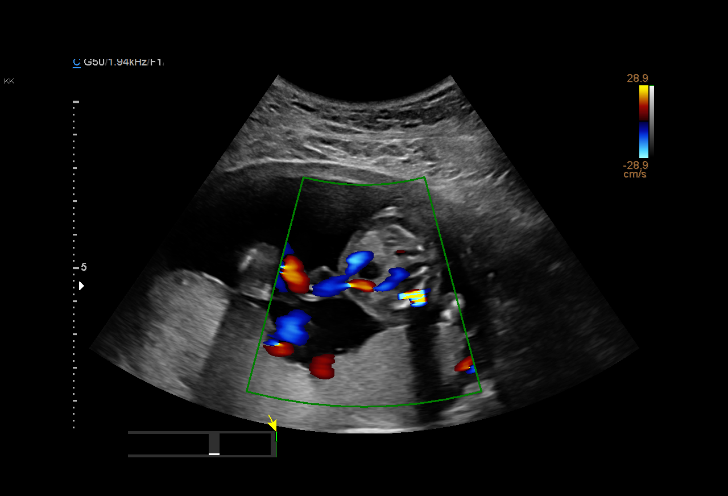
[im 54/132]
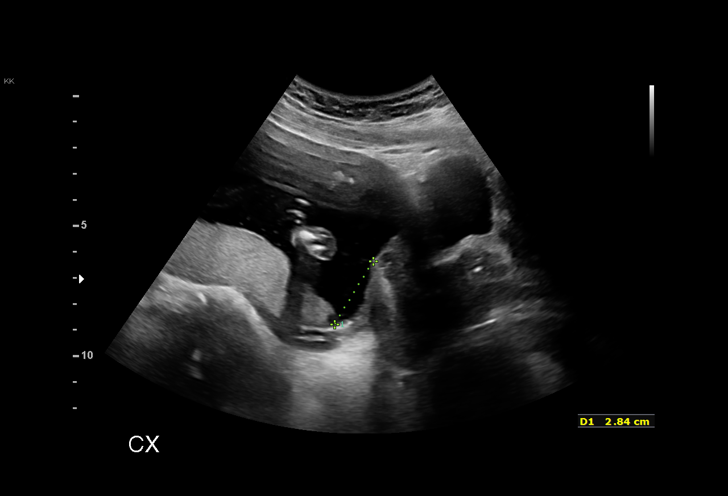
[im 68/132]
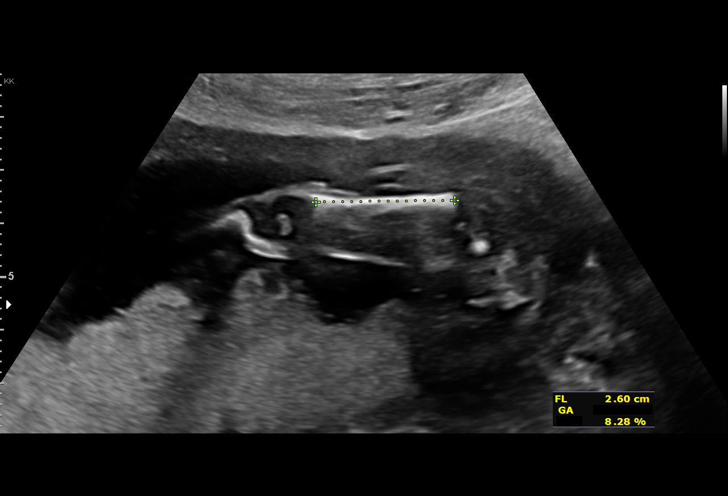
[im 78/132]
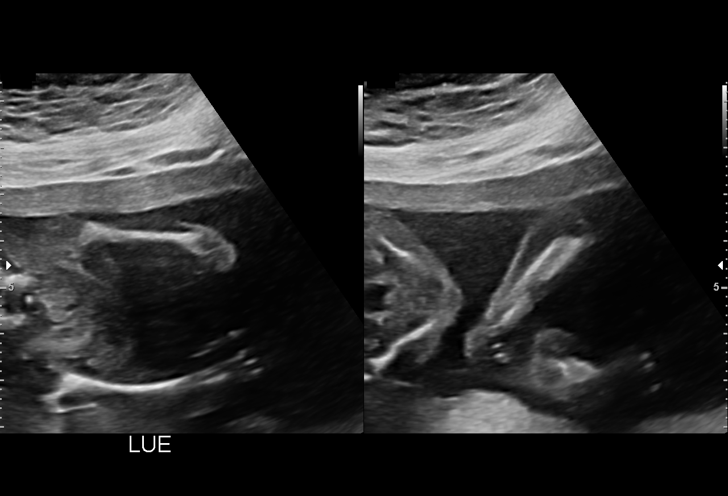
[im 88/132]
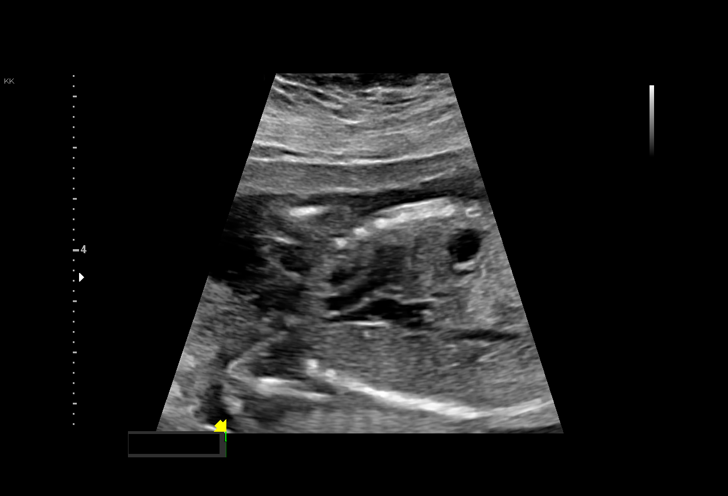
[im 98/132]
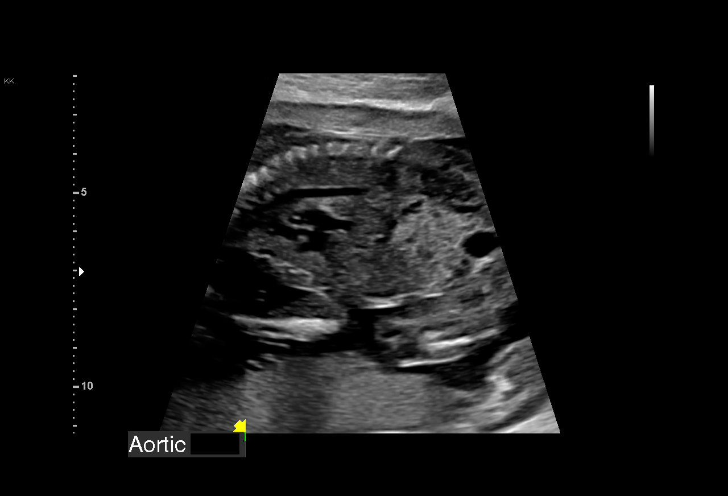
[im 107/132]
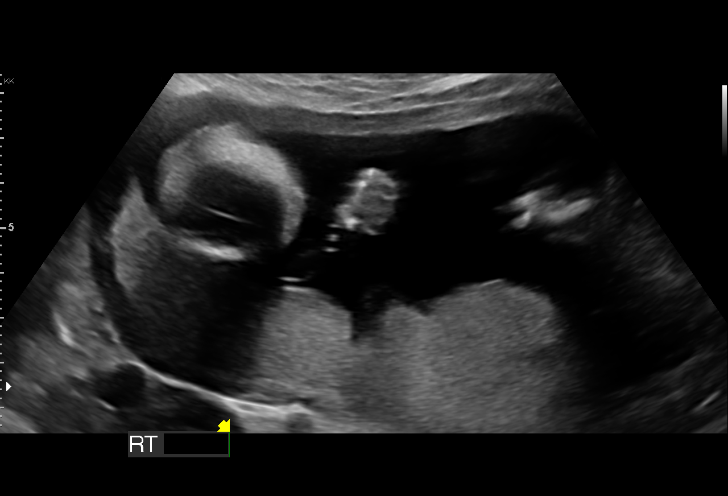
[im 117/132]
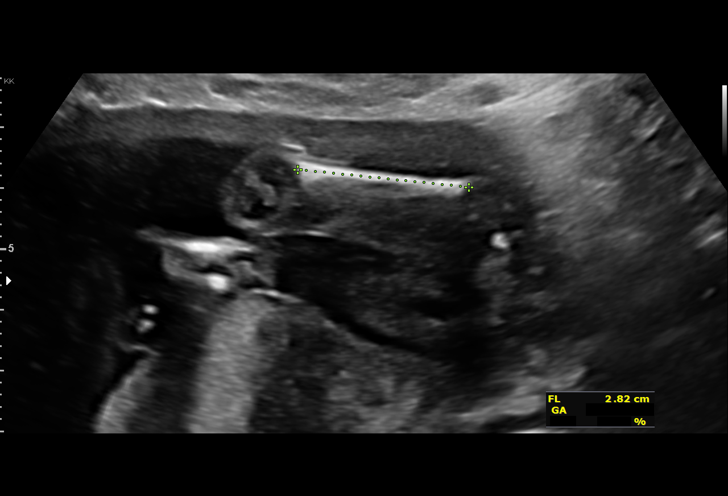
[im 127/132]
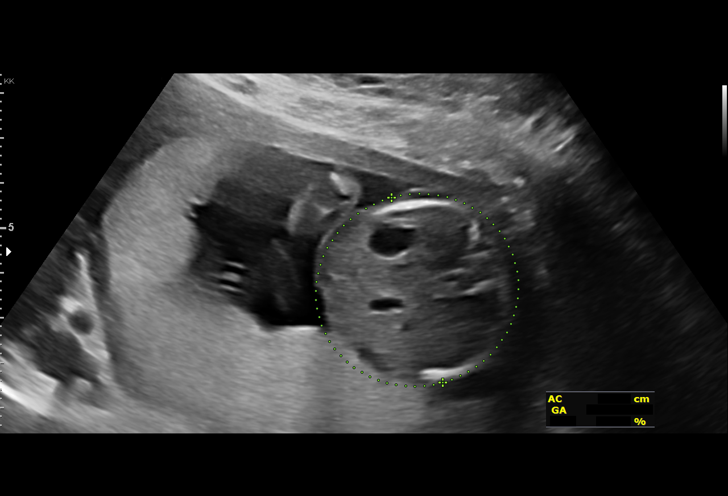

[13 of 28 positions shown; findings below may reference images not displayed]

Indications

 Fetal choroid plexus cyst
 19 weeks gestation of pregnancy
 Antenatal screening for malformations
 Low risk NIPS
Fetal Evaluation

 Num Of Fetuses:          1
 Fetal Heart Rate(bpm):   145
 Cardiac Activity:        Observed
 Presentation:            Breech
 Placenta:                Posterior
 P. Cord Insertion:       Visualized

 Amniotic Fluid
 AFI FV:      Within normal limits

                             Largest Pocket(cm)

Biometry

 BPD:      43.2  mm     G. Age:  19w 0d         47  %    CI:        71.41   %    70 - 86
                                                         FL/HC:       17.4  %    16.1 -
 HC:      162.8  mm     G. Age:  19w 0d         38  %    HC/AC:       1.19       1.09 -
 AC:      137.1  mm     G. Age:  19w 1d         45  %    FL/BPD:      65.5  %
 FL:       28.3  mm     G. Age:  18w 5d         27  %    FL/AC:       20.6  %    20 - 24
 HUM:      27.9  mm     G. Age:  19w 0d         46  %
 CER:      18.5  mm     G. Age:  18w 2d         10  %
 NFT:       4.3  mm
 LV:        4.3  mm
 CM:        3.2  mm

 Est. FW:     265   gm     0 lb 9 oz     33  %
OB History

 Gravidity:    1         Term:   0        Prem:   0        SAB:   0
 TOP:          0       Ectopic:  0        Living: 0
Gestational Age

 LMP:           21w 3d        Date:  05/20/21                 EDD:   02/24/22
 U/S Today:     19w 0d                                        EDD:   03/13/22
 Best:          19w 1d     Det. By:  Early Ultrasound         EDD:   03/12/22
                                     (08/02/21)
Anatomy

 Cranium:               Appears normal         LVOT:                   Appears normal
 Cavum:                 Appears normal         Aortic Arch:            Appears normal
 Ventricles:            Appears normal         Ductal Arch:            Appears normal
 Choroid Plexus:        Bilateral choroid      Diaphragm:              Appears normal
                        plexus cysts
 Cerebellum:            Appears normal         Stomach:                Appears normal, left
                                                                       sided
 Posterior Fossa:       Appears normal         Abdomen:                Appears normal
 Nuchal Fold:           Appears normal         Abdominal Wall:         Appears nml (cord
                                                                       insert, abd wall)
 Face:                  Orbits nl; profile not Cord Vessels:           Appears normal (3
                        well visualized                                vessel cord)
 Lips:                  Appears normal         Kidneys:                Appear normal
 Palate:                Not well visualized    Bladder:                Appears normal
 Thoracic:              Appears normal         Spine:                  Appears normal
 Heart:                 Appears normal         Upper Extremities:      Appears normal
                        (4CH, axis, and
                        situs)
 RVOT:                  Appears normal         Lower Extremities:      Appears normal

 Other:  3VV, 3VTV, VC, lenses,heels/feet and open hands/5th digits
         visualized. Fetus appears to be a male. Technically difficult due to
         fetal position.
Cervix Uterus Adnexa

 Cervix
 Length:           3.55  cm.
 Normal appearance by transabdominal scan.

 Uterus
 No abnormality visualized.

 Right Ovary
 Within normal limits.

 Left Ovary
 Within normal limits.
 Cul De Sac
 No free fluid seen.

 Adnexa
 No abnormality visualized.
Impression

 G1 P0. Patient is here for fetal anatomy scan.

 On cell-free fetal DNA screening, the risks of fetal
 aneuploidies are not increased .

 We performed fetal anatomy scan. Bilateral choroid plexus
 cysts (CPC) No other makers of aneuploidies or fetal
 structural defects are seen. Fetal biometry is consistent with
 her previously-established dates. Amniotic fluid is normal and
 good fetal activity is seen. Patient understands the limitations
 of ultrasound in detecting fetal anomalies.
 I informed the patient that given that she had Balbala Binu Djbout[place] Rogueh for fetal
 aneuploidies on cell-free fetal DNA screening, finding of CPC
 should be considered a normal variant and that the risk of
 trisomy 18 is not increased. I also reassured that CPC
 resolves with advancing gestation and is not associated with
 any intracranial malformations.
Recommendations

 -An appointment was made for her to return in 4 weeks for
 completion of fetal anatomy.
                Wong, Khrystian

## 2023-04-09 ENCOUNTER — Other Ambulatory Visit: Payer: Self-pay

## 2023-04-09 ENCOUNTER — Emergency Department (HOSPITAL_COMMUNITY)
Admission: EM | Admit: 2023-04-09 | Discharge: 2023-04-09 | Disposition: A | Discharge status: Home / Self Care | Payer: Medicaid Other | Attending: Student | Admitting: Student

## 2023-04-09 DIAGNOSIS — F410 Panic disorder [episodic paroxysmal anxiety] without agoraphobia: Secondary | ICD-10-CM | POA: Insufficient documentation

## 2023-04-09 LAB — POC URINE PREG, ED: Preg Test, Ur: NEGATIVE

## 2023-04-09 MED ORDER — ALPRAZOLAM 0.5 MG PO TABS: 0.5000 mg | ORAL_TABLET | Freq: Every evening | ORAL | 0 refills | Status: AC | PRN

## 2023-04-09 MED ORDER — ALPRAZOLAM 0.5 MG PO TABS
0.5000 mg | ORAL_TABLET | Freq: Once | ORAL | Status: AC
  Administered 2023-04-09: 0.5 mg via ORAL
  Filled 2023-04-09: qty 1

## 2023-04-09 NOTE — ED Notes (Signed)
Dc instructions and scripts reviewed with pt no questions or concerns at this time.

## 2023-04-09 NOTE — Discharge Instructions (Signed)
Seen today for anxiety attack.  A small amount of Xanax to use if this recurs but this is not a medication to use frequently.  Follow-up with your primary care doctor.  It may be helpful to start daily anxiety medicines if this becomes more recurrent, or to work with a Veterinary surgeon.

## 2023-04-09 NOTE — ED Triage Notes (Signed)
Pt in hospital with sick child and felt panic attack coming on and requesting something for anxiety to help calm nerves

## 2023-04-09 NOTE — ED Provider Notes (Signed)
[facility] AT [facility] Provider Note   CSN: [id number] Arrival date & time: 04/09/23  1659     History  Chief Complaint  Patient presents with   Anxiety    Kaitlyn Peters is a 27 y.o. female.  She denies any significant chronic medical issues.  Presents the ER today for anxiety.  She is actually here with her son who is being seen by myself as a patient and has no abnormal findings concerning for possible leukemia, patient will ultimately need outpatient follow-up and is doing well but patient became very anxious when talking about the possible differential and need for follow-up.  States she has had panic attacks in the past and feels 1 coming on now, states she was seen in the ER for it, was given some medicine that really helped.  She states if she goes home she knows she is going to feel worse and is requesting some medication at this time.-Is a pregnancy, no chest pain, no shortness breath, no fever, states feels just like before when her panic reports attack started that progressed to severe shortness of breath and chest pain   Anxiety       Home Medications Prior to Admission medications   Medication Sig Start Date End Date Taking? Authorizing Provider  acetaminophen (TYLENOL) 500 MG tablet Take 1,000 mg by mouth daily as needed (pain).    [provider]  ALPRAZolam Prudy Feeler) 0.5 MG tablet Take 1 tablet (0.5 mg total) by mouth at bedtime as needed for anxiety. 04/09/23   Carmel Sacramento A, PA-C  Calcium Carbonate Antacid (TUMS PO) Take 4 tablets by mouth as needed (heartburn).    [provider]  famotidine (PEPCID) 20 MG tablet Take 1 tablet (20 mg total) by mouth 2 (two) times daily. 12/24/22   Smitty Knudsen, PA-C  montelukast (SINGULAIR) 10 MG tablet Take 10 mg by mouth at bedtime.    [provider]  phentermine (ADIPEX-P) 37.5 MG tablet Take 37.5 mg by mouth daily before breakfast. 10/13/22   [provider]   VENTOLIN HFA 108 (90 Base) MCG/ACT inhaler Inhale 2 puffs into the lungs every 6 (six) hours as needed. 10/11/22   [provider]      Allergies    Metformin and related    Review of Systems   Review of Systems  Physical Exam Updated Vital Signs BP 106/77   Pulse 78   Temp 98 F (36.7 C) (Oral)   Resp 18   Ht 5\' 1"  (1.549 m)   Wt 74 kg   LMP 03/14/2023 (Exact Date)   SpO2 100%   BMI 30.83 kg/m  Physical Exam Vitals and nursing note reviewed.  Constitutional:      General: She is not in acute distress.    Appearance: She is well-developed.     Comments: Appears anxious, tearful   HENT:     Head: Normocephalic and atraumatic.  Eyes:     Conjunctiva/sclera: Conjunctivae normal.  Cardiovascular:     Rate and Rhythm: Normal rate and regular rhythm.     Heart sounds: No murmur heard. Pulmonary:     Effort: Pulmonary effort is normal. No respiratory distress.     Breath sounds: Normal breath sounds.  Abdominal:     Palpations: Abdomen is soft.     Tenderness: There is no abdominal tenderness.  Musculoskeletal:        General: No swelling.     Cervical back: Neck supple.  Skin:    General: Skin is warm and dry.     Capillary Refill: Capillary refill takes less than 2 seconds.  Neurological:     General: No focal deficit present.     Mental Status: She is alert and oriented to person, place, and time.  Psychiatric:        Mood and Affect: Mood normal.     ED Results / Procedures / Treatments   Labs (all labs ordered are listed, but only abnormal results are displayed) Labs Reviewed  POC URINE PREG, ED    EKG None  Radiology No results found.  Procedures Procedures    Medications Ordered in ED Medications  ALPRAZolam (XANAX) tablet 0.5 mg (has no administration in time range)    ED Course/ Medical Decision Making/ A&P                                 Medical Decision Making This patient presents to the ED for concern of anxiety, this  involves an extensive number of treatment options, and is a complaint that carries with it a high risk of complications and morbidity.  The differential diagnosis includes panic attack, adjustment disorder, PE, other   Co morbidities that complicate the patient evaluation :   None   Additional history obtained:  Additional history obtained from EMR External records from outside source obtained and reviewed including notes   Problem List / ED Course / Critical interventions / Medication management  Patient developed anxiety while here with her son who is undergoing workup, found to have some abnormal lab test that we will need follow-up but leukemia on the differential, she been very anxious when we were discussing the need for follow-up and possibility that if does not resolve with the acute illness he will need further heme-onc workup.  She was very reassured that he looks well and heme-onc doctors that this may be due to his illness but is still very anxious and tearful, with had panic attack once the past, which the records and she got half milligram Xanax, this resolved, she not have any chest pain or shortness of breath at present, no history of VTE.  She is not pregnant.  Do not feel she needs further workup as she had an obvious trigger while here in the ER.  Will give very limited quantity of benzos, advised on close follow-up with PCP and possibly with counseling she has any ongoing anxiety and I ordered medication including xanax for anxiety  I have reviewed the patients home medicines and have made adjustments as needed      Risk Prescription drug management.           Final Clinical Impression(s) / ED Diagnoses Final diagnoses:  Anxiety attack    Rx / DC Orders ED Discharge Orders          Ordered    ALPRAZolam (XANAX) 0.5 MG tablet  At bedtime PRN        04/09/23 1730              Ma Rings, PA-C 04/09/23 1737    Kommor, Wyn Forster,  MD 04/10/23 1126

## 2023-04-22 ENCOUNTER — Other Ambulatory Visit: Payer: Self-pay

## 2023-04-22 ENCOUNTER — Emergency Department (HOSPITAL_COMMUNITY): Payer: Medicaid Other

## 2023-04-22 ENCOUNTER — Emergency Department (HOSPITAL_COMMUNITY)
Admission: EM | Admit: 2023-04-22 | Discharge: 2023-04-22 | Disposition: A | Discharge status: Home / Self Care | Payer: Medicaid Other | Attending: Emergency Medicine | Admitting: Emergency Medicine

## 2023-04-22 DIAGNOSIS — R197 Diarrhea, unspecified: Secondary | ICD-10-CM | POA: Insufficient documentation

## 2023-04-22 DIAGNOSIS — J45909 Unspecified asthma, uncomplicated: Secondary | ICD-10-CM | POA: Insufficient documentation

## 2023-04-22 DIAGNOSIS — D72829 Elevated white blood cell count, unspecified: Secondary | ICD-10-CM | POA: Insufficient documentation

## 2023-04-22 DIAGNOSIS — R112 Nausea with vomiting, unspecified: Secondary | ICD-10-CM | POA: Diagnosis present

## 2023-04-22 LAB — URINALYSIS, ROUTINE W REFLEX MICROSCOPIC
Bacteria, UA: NONE SEEN
Bilirubin Urine: NEGATIVE
Glucose, UA: NEGATIVE mg/dL
Ketones, ur: NEGATIVE mg/dL
Nitrite: NEGATIVE
Protein, ur: 30 mg/dL — AB
Specific Gravity, Urine: 1.024 (ref 1.005–1.030)
pH: 5 (ref 5.0–8.0)

## 2023-04-22 LAB — CBC WITH DIFFERENTIAL/PLATELET
Abs Immature Granulocytes: 0.03 10*3/uL (ref 0.00–0.07)
Basophils Absolute: 0 10*3/uL (ref 0.0–0.1)
Basophils Relative: 0 %
Eosinophils Absolute: 0.2 10*3/uL (ref 0.0–0.5)
Eosinophils Relative: 2 %
HCT: 43.2 % (ref 36.0–46.0)
Hemoglobin: 14.3 g/dL (ref 12.0–15.0)
Immature Granulocytes: 0 %
Lymphocytes Relative: 15 %
Lymphs Abs: 1.7 10*3/uL (ref 0.7–4.0)
MCH: 28.7 pg (ref 26.0–34.0)
MCHC: 33.1 g/dL (ref 30.0–36.0)
MCV: 86.7 fL (ref 80.0–100.0)
Monocytes Absolute: 0.6 10*3/uL (ref 0.1–1.0)
Monocytes Relative: 5 %
Neutro Abs: 9.1 10*3/uL — ABNORMAL HIGH (ref 1.7–7.7)
Neutrophils Relative %: 78 %
Platelets: 328 10*3/uL (ref 150–400)
RBC: 4.98 MIL/uL (ref 3.87–5.11)
RDW: 12.5 % (ref 11.5–15.5)
WBC: 11.7 10*3/uL — ABNORMAL HIGH (ref 4.0–10.5)
nRBC: 0 % (ref 0.0–0.2)

## 2023-04-22 LAB — C DIFFICILE QUICK SCREEN W PCR REFLEX
C Diff antigen: NEGATIVE
C Diff interpretation: NOT DETECTED
C Diff toxin: NEGATIVE

## 2023-04-22 LAB — COMPREHENSIVE METABOLIC PANEL
ALT: 13 U/L (ref 0–44)
AST: 16 U/L (ref 15–41)
Albumin: 4.2 g/dL (ref 3.5–5.0)
Alkaline Phosphatase: 82 U/L (ref 38–126)
Anion gap: 13 (ref 5–15)
BUN: 6 mg/dL (ref 6–20)
CO2: 22 mmol/L (ref 22–32)
Calcium: 9.1 mg/dL (ref 8.9–10.3)
Chloride: 101 mmol/L (ref 98–111)
Creatinine, Ser: 0.86 mg/dL (ref 0.44–1.00)
GFR, Estimated: >60 mL/min (ref >60)
Glucose, Bld: 94 mg/dL (ref 70–99)
Potassium: 3.5 mmol/L (ref 3.5–5.1)
Sodium: 136 mmol/L (ref 135–145)
Total Bilirubin: 0.5 mg/dL (ref 0.3–1.2)
Total Protein: 7.9 g/dL (ref 6.5–8.1)

## 2023-04-22 LAB — HCG, SERUM, QUALITATIVE: Preg, Serum: NEGATIVE

## 2023-04-22 LAB — LIPASE, BLOOD: Lipase: 28 U/L (ref 11–51)

## 2023-04-22 LAB — MAGNESIUM: Magnesium: 1.9 mg/dL (ref 1.7–2.4)

## 2023-04-22 MED ORDER — PROMETHAZINE HCL 25 MG RE SUPP: 25.0000 mg | Freq: Four times a day (QID) | RECTAL | 0 refills | Status: AC | PRN

## 2023-04-22 MED ORDER — SODIUM CHLORIDE 0.9 % IV SOLN
12.5000 mg | Freq: Once | INTRAVENOUS | Status: DC
  Filled 2023-04-22: qty 0.5

## 2023-04-22 MED ORDER — ONDANSETRON 4 MG PO TBDP: 4.0000 mg | ORAL_TABLET | Freq: Three times a day (TID) | ORAL | 0 refills | Status: AC | PRN

## 2023-04-22 MED ORDER — ONDANSETRON HCL 4 MG/2ML IJ SOLN
4.0000 mg | Freq: Once | INTRAMUSCULAR | Status: AC
  Administered 2023-04-22: 4 mg via INTRAVENOUS
  Filled 2023-04-22: qty 2

## 2023-04-22 MED ORDER — IOHEXOL 300 MG/ML  SOLN
100.0000 mL | Freq: Once | INTRAMUSCULAR | Status: AC | PRN
  Administered 2023-04-22: 100 mL via INTRAVENOUS

## 2023-04-22 MED ORDER — LACTATED RINGERS IV BOLUS
2000.0000 mL | Freq: Once | INTRAVENOUS | Status: AC
  Administered 2023-04-22: 2000 mL via INTRAVENOUS

## 2023-04-22 MED ORDER — METOCLOPRAMIDE HCL 5 MG/ML IJ SOLN
10.0000 mg | Freq: Once | INTRAMUSCULAR | Status: AC
  Administered 2023-04-22: 10 mg via INTRAVENOUS
  Filled 2023-04-22: qty 2

## 2023-04-22 NOTE — Discharge Instructions (Signed)
Take Zofran as needed for nausea.  If this fails, there is a prescription for Phenergan suppositories sent to your pharmacy as well.  Maintain good hydration to replace fluid losses from diarrhea.  Results of your GI pathogen panel should be available within the next 2 days.  Follow-up with your primary care doctor.  Return to the emergency department for any return of severe symptoms.

## 2023-04-22 NOTE — ED Triage Notes (Signed)
Patient stated started to have Nausea and Diarrhea Tuesday, supposed too see MD Thursday but due to weather was not able to weather was unable to attend Doctors appointment and had to reschedule for two week later.

## 2023-04-22 NOTE — ED Notes (Signed)
Pt stated she felt like she needs to have a BM, "I just couldn't go, I just threw up, I thought I needed to go, but I guess not"

## 2023-04-22 NOTE — ED Notes (Signed)
Patient states "I feel a lot better" no episodes of vomiting since phenergan given.

## 2023-04-22 NOTE — ED Notes (Signed)
hCG, serum quanlitative pregnancy serum test was negative

## 2023-04-22 NOTE — ED Notes (Signed)
Patient transported to CT 

## 2023-04-22 NOTE — ED Notes (Signed)
Pt informed of need for urine and stool samples, gave pt call light and informed to call staff when pt needs to go to bathroom. Pt understanding.

## 2023-04-22 NOTE — ED Notes (Signed)
Pt ambulated to the BR without assistance. 

## 2023-04-22 NOTE — ED Notes (Signed)
Pt presents to nursing station asking to leave and have IV access removed. RN informed.

## 2023-04-22 NOTE — ED Notes (Signed)
Patient came to nurses station states "I fells better and ready to go." Notified MD that patient is wanting to leave.

## 2023-04-22 NOTE — ED Provider Notes (Signed)
EMERGENCY DEPARTMENT AT [facility] Provider Note   CSN: [id number] Arrival date & time: 04/22/23  4098     History  Chief Complaint  Patient presents with   Diarrhea and Vomiting     Kaitlyn Peters is a 27 y.o. female.  HPI Patient presents for nausea, vomiting diarrhea, and abdominal pain.  Medical history includes asthma, PCOS.  She was seen in the ED 2 weeks ago for anxiety.  5 days ago, she developed nausea, vomiting, and diarrhea.  The following day, symptoms seemed resolved, however symptoms returned the day after.  For 3 days now, she has had ongoing diarrhea, nausea, and vomiting.  She has been able to tolerate only small amounts of p.o. intake.  She we will avoid eating during the day because eating seems to worsen her symptoms.  She has had similar symptoms in the past.  She was told that these were from "stomach issues".  She has been prescribed Pepcid and Carafate.  She takes these on an as-needed basis.  She did try taking these for the last couple days but it did not help.  She has also tried Zofran but was not able to keep it down.  She denies any history of abdominal surgeries.  LMP was 1 month ago.  Currently, she has pain in her epigastrium.  She has ongoing nausea.  Since arriving in the ED, she has had 2 episodes where she would have to go to the bathroom to both vomit and have diarrhea.    Home Medications Prior to Admission medications   Medication Sig Start Date End Date Taking? Authorizing Provider  ondansetron (ZOFRAN-ODT) 4 MG disintegrating tablet Take 1 tablet (4 mg total) by mouth every 8 (eight) hours as needed for nausea or vomiting. 04/22/23  Yes Gloris Manchester, MD  promethazine (PHENERGAN) 25 MG suppository Place 1 suppository (25 mg total) rectally every 6 (six) hours as needed for nausea or vomiting. 04/22/23  Yes Gloris Manchester, MD  acetaminophen (TYLENOL) 500 MG tablet Take 1,000 mg by mouth daily as needed (pain).    [provider]   ALPRAZolam Prudy Feeler) 0.5 MG tablet Take 1 tablet (0.5 mg total) by mouth at bedtime as needed for anxiety. 04/09/23   Carmel Sacramento A, PA-C  Calcium Carbonate Antacid (TUMS PO) Take 4 tablets by mouth as needed (heartburn).    [provider]  famotidine (PEPCID) 20 MG tablet Take 1 tablet (20 mg total) by mouth 2 (two) times daily. 12/24/22   Smitty Knudsen, PA-C  montelukast (SINGULAIR) 10 MG tablet Take 10 mg by mouth at bedtime.    [provider]  phentermine (ADIPEX-P) 37.5 MG tablet Take 37.5 mg by mouth daily before breakfast. 10/13/22   [provider]  VENTOLIN HFA 108 (90 Base) MCG/ACT inhaler Inhale 2 puffs into the lungs every 6 (six) hours as needed. 10/11/22   [provider]      Allergies    Metformin and related    Review of Systems   Review of Systems  Gastrointestinal:  Positive for abdominal pain, diarrhea, nausea and vomiting.  All other systems reviewed and are negative.   Physical Exam Updated Vital Signs BP 101/69 (BP Location: Right Arm)   Pulse 88   Temp 98.4 F (36.9 C) (Oral)   Resp 19   Ht 5\' 1"  (1.549 m)   Wt 77.1 kg   LMP 03/14/2023 (Exact Date)   SpO2 100%   BMI 32.12 kg/m  Physical  Exam Vitals and nursing note reviewed.  Constitutional:      General: She is not in acute distress.    Appearance: Normal appearance. She is well-developed. She is not ill-appearing, toxic-appearing or diaphoretic.  HENT:     Head: Normocephalic and atraumatic.     Right Ear: External ear normal.     Left Ear: External ear normal.     Nose: Nose normal.     Mouth/Throat:     Mouth: Mucous membranes are moist.  Eyes:     Extraocular Movements: Extraocular movements intact.     Conjunctiva/sclera: Conjunctivae normal.  Cardiovascular:     Rate and Rhythm: Normal rate and regular rhythm.     Heart sounds: No murmur heard. Pulmonary:     Effort: Pulmonary effort is normal. No respiratory distress.     Breath sounds: Normal  breath sounds. No wheezing or rales.  Chest:     Chest wall: No tenderness.  Abdominal:     General: There is no distension.     Palpations: Abdomen is soft.     Tenderness: There is abdominal tenderness. There is no guarding or rebound.  Musculoskeletal:        General: No swelling. Normal range of motion.     Cervical back: Normal range of motion and neck supple.     Right lower leg: No edema.     Left lower leg: No edema.  Skin:    General: Skin is warm and dry.     Capillary Refill: Capillary refill takes less than 2 seconds.     Coloration: Skin is not jaundiced or pale.     Findings: No erythema.  Neurological:     General: No focal deficit present.     Mental Status: She is alert and oriented to person, place, and time.  Psychiatric:        Mood and Affect: Mood normal.        Behavior: Behavior normal.     ED Results / Procedures / Treatments   Labs (all labs ordered are listed, but only abnormal results are displayed) Labs Reviewed  CBC WITH DIFFERENTIAL/PLATELET - Abnormal; Notable for the following components:      Result Value   WBC 11.7 (*)    Neutro Abs 9.1 (*)    All other components within normal limits  URINALYSIS, ROUTINE W REFLEX MICROSCOPIC - Abnormal; Notable for the following components:   APPearance TURBID (*)    Hgb urine dipstick MODERATE (*)    Protein, ur 30 (*)    Leukocytes,Ua TRACE (*)    All other components within normal limits  C DIFFICILE QUICK SCREEN W PCR REFLEX    GASTROINTESTINAL PANEL BY PCR, STOOL (REPLACES STOOL CULTURE)  COMPREHENSIVE METABOLIC PANEL  LIPASE, BLOOD  MAGNESIUM  HCG, SERUM, QUALITATIVE    EKG EKG Interpretation Date/Time:  Sunday April 22 2023 08:45:14 EDT Ventricular Rate:  76 PR Interval:  158 QRS Duration:  88 QT Interval:  383 QTC Calculation: 431 R Axis:   100  Text Interpretation: Sinus rhythm Borderline right axis deviation Confirmed by Gloris Manchester ([id number]) on 04/22/2023 10:12:34  AM  Radiology CT ABDOMEN PELVIS W CONTRAST  Result Date: 04/22/2023 CLINICAL DATA:  Abdominal pain, nausea, diarrhea EXAM: CT ABDOMEN AND PELVIS WITH CONTRAST TECHNIQUE: Multidetector CT imaging of the abdomen and pelvis was performed using the standard protocol following bolus administration of intravenous contrast. RADIATION DOSE REDUCTION: This exam was performed according to the departmental dose-optimization program which includes  automated exposure control, adjustment of the mA and/or kV according to patient size and/or use of iterative reconstruction technique. CONTRAST:  OMNIPAQUE IOHEXOL 300 MG/ML  SOLN COMPARISON:  CT 07/02/2022 FINDINGS: Lower chest: No pleural or pericardial effusion. Visualized lung bases clear. Hepatobiliary: No focal liver abnormality is seen. No gallstones, gallbladder wall thickening, or biliary dilatation. Pancreas: Unremarkable. No pancreatic ductal dilatation or surrounding inflammatory changes. Spleen: Normal in size without focal abnormality. Adrenals/Urinary Tract: Adrenal glands are unremarkable. Kidneys are normal, without renal calculi, focal lesion, or hydronephrosis. Bladder is unremarkable. Stomach/Bowel: Stomach is within normal limits. Appendix appears normal. No evidence of bowel wall thickening, distention, or inflammatory changes. Vascular/Lymphatic: No significant vascular findings are present. No enlarged abdominal or pelvic lymph nodes. Reproductive: Uterus and bilateral adnexa are unremarkable. Other: No ascites.  No free air. Musculoskeletal: No acute or significant osseous findings. IMPRESSION: Negative. Electronically Signed   By: Corlis Leak M.D.   On: 04/22/2023 10:30    Procedures Procedures    Medications Ordered in ED Medications  lactated ringers bolus 2,000 mL (0 mLs Intravenous Stopped 04/22/23 1107)  ondansetron (ZOFRAN) injection 4 mg (4 mg Intravenous Given 04/22/23 0901)  iohexol (OMNIPAQUE) 300 MG/ML solution 100 mL (100 mLs  Intravenous Contrast Given 04/22/23 0954)  metoCLOPramide (REGLAN) injection 10 mg (10 mg Intravenous Given 04/22/23 1139)    ED Course/ Medical Decision Making/ A&P                                 Medical Decision Making Amount and/or Complexity of Data Reviewed Labs: ordered. Radiology: ordered.  Risk Prescription drug management.   This patient presents to the ED for concern of diarrhea, nausea, vomiting, this involves an extensive number of treatment options, and is a complaint that carries with it a high risk of complications and morbidity.  The differential diagnosis includes enteritis, polypharmacy, malabsorption, cyclic vomiting, pregnancy, dehydration, metabolic derangements   Co morbidities that complicate the patient evaluation  Asthma, PCOS, anxiety   Additional history obtained:  Additional history obtained from N/A External records from outside source obtained and reviewed including EMR   Lab Tests:  I Ordered, and personally interpreted labs.  The pertinent results include: Mild leukocytosis, normal hemoglobin, normal kidney function, normal electrolytes, normal hepatobiliary enzymes, normal lipase, negative C. difficile studies.   Imaging Studies ordered:  I ordered imaging studies including CT of abdomen and pelvis I independently visualized and interpreted imaging which showed no acute findings I agree with the radiologist interpretation   Cardiac Monitoring: / EKG:  The patient was maintained on a cardiac monitor.  I personally viewed and interpreted the cardiac monitored which showed an underlying rhythm of: Sinus rhythm  Problem List / ED Course / Critical interventions / Medication management  Patient presents for 3 straight days of ongoing nausea, vomiting, diarrhea.  She has had poor p.o. intake.  On arrival in the ED, her vital signs are normal.  She is well-appearing on exam.  She endorses ongoing epigastric pain.  Her abdomen is soft and she  does have some mild tenderness in upper abdomen.  Given was described as large volume fluid losses, 2 L IV fluid were ordered.  Zofran was ordered for nausea.  Laboratory workup was initiated.  Lab results are reassuring.  Patient had recurrence of nausea while in the ED.  Dose of Reglan was given.  After this, she did have resolution of symptoms.  She was able to tolerate p.o. intake.  She underwent CT scan of abdomen pelvis which was negative for acute findings.  CT studies were negative.  GI pathogen panel pending.  She does feel comfortable with discharge home.  She was provided with prescription for antiemetics to take as needed.  She was discharged in good condition. I ordered medication including IV fluids for hydration; Zofran and Reglan for nausea Reevaluation of the patient after these medicines showed that the patient improved I have reviewed the patients home medicines and have made adjustments as needed   Social Determinants of Health:  Has PCP        Final Clinical Impression(s) / ED Diagnoses Final diagnoses:  Diarrhea, unspecified type  Nausea and vomiting, unspecified vomiting type    Rx / DC Orders ED Discharge Orders          Ordered    ondansetron (ZOFRAN-ODT) 4 MG disintegrating tablet  Every 8 hours PRN        04/22/23 1334    promethazine (PHENERGAN) 25 MG suppository  Every 6 hours PRN        04/22/23 1334              Gloris Manchester, MD 04/22/23 1335

## 2023-04-23 LAB — GASTROINTESTINAL PANEL BY PCR, STOOL (REPLACES STOOL CULTURE)

## 2023-05-07 IMAGING — US US RENAL
1 series · 15 of 25 positions shown · non-contrast
Comparison: None.

CLINICAL DATA: Right flank pain.  Twenty-four weeks pregnant.

EXAM:
RENAL / URINARY TRACT ULTRASOUND COMPLETE

[Series 1: us renal · 15 of 35 slices shown]
[im 1/35]
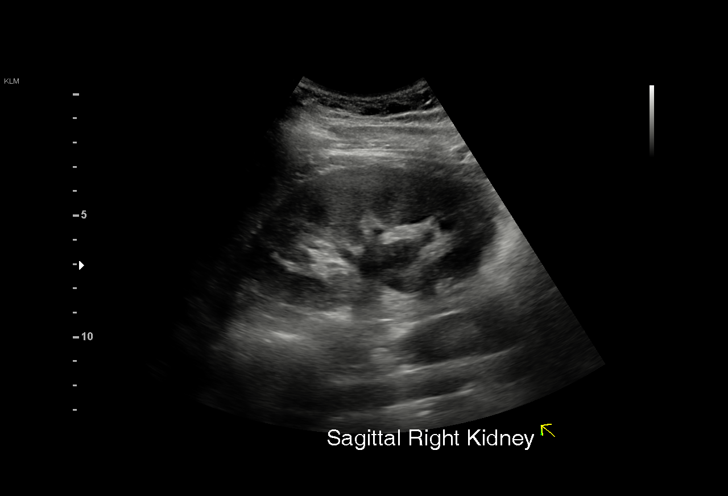
[im 3/35]
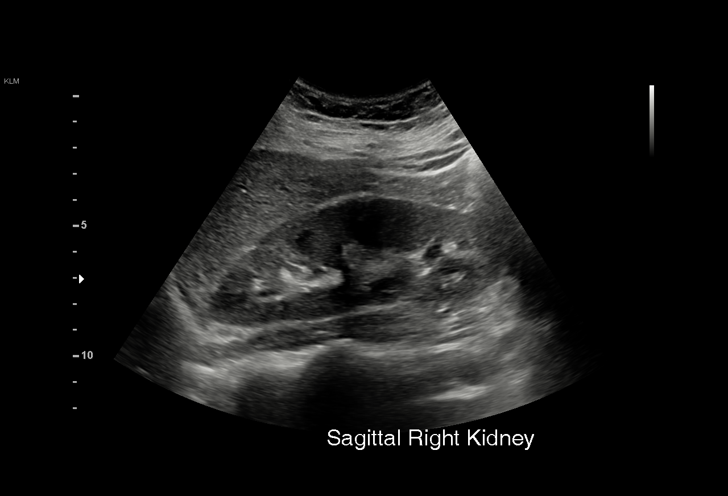
[im 6/35]
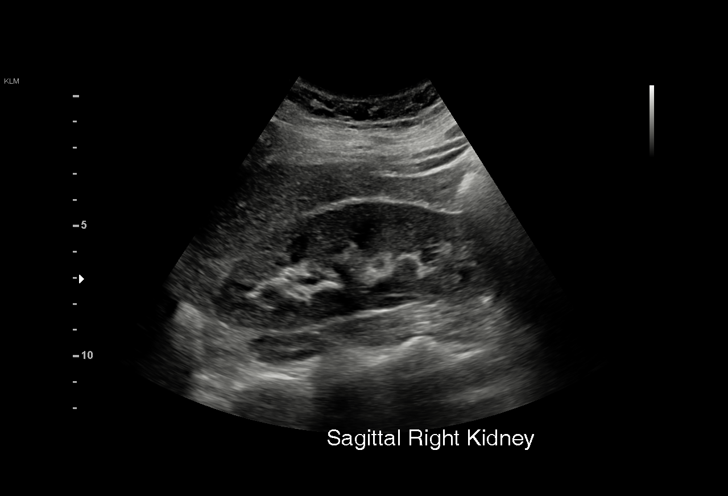
[im 8/35]
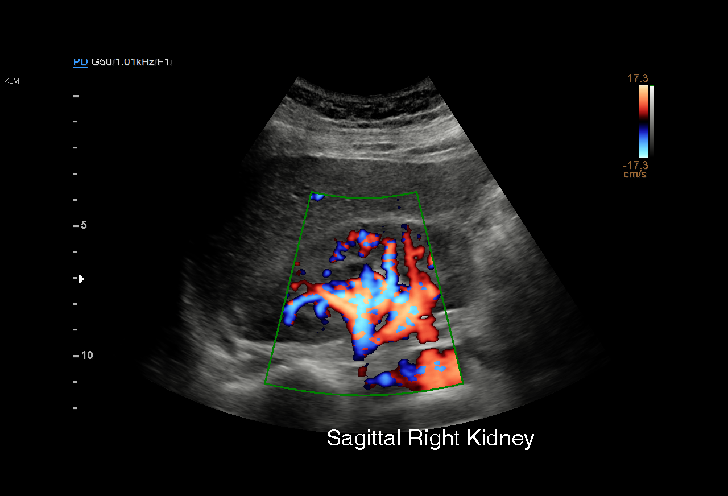
[im 10/35]
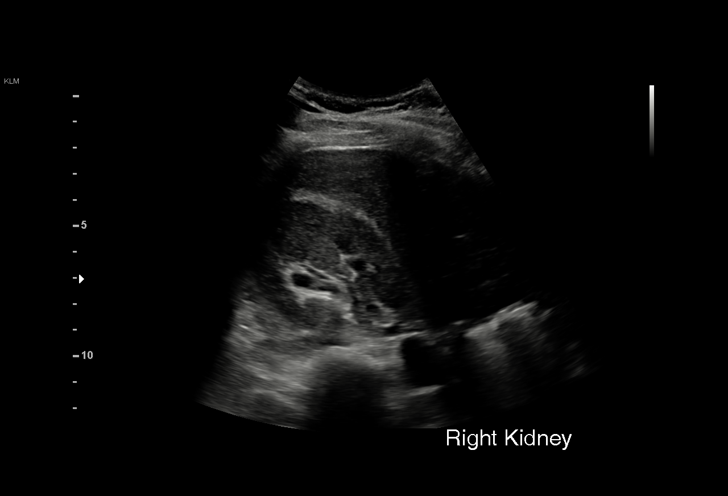
[im 13/35]
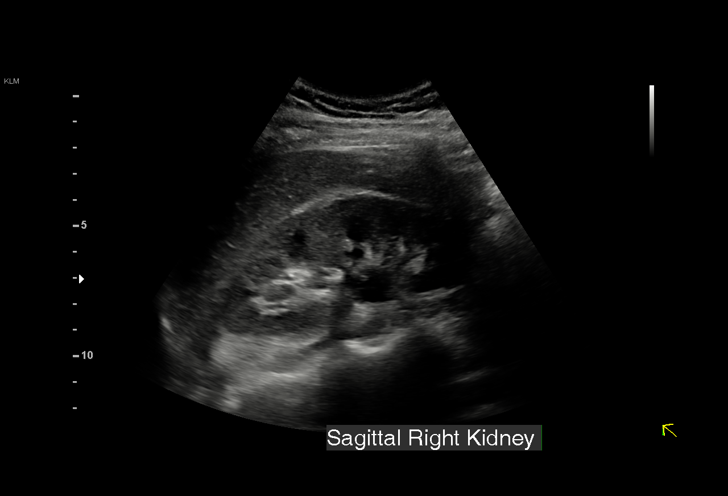
[im 15/35]
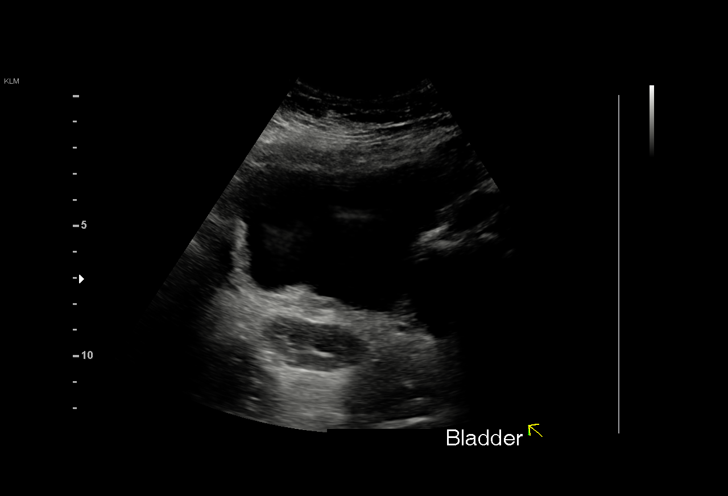
[im 18/35]
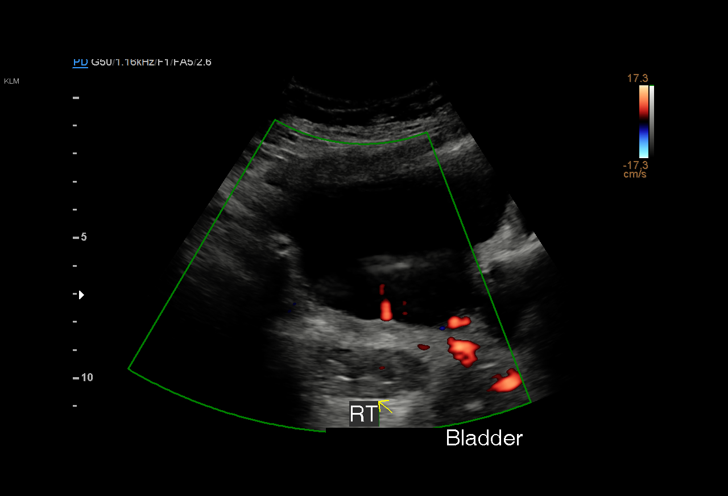
[im 20/35]
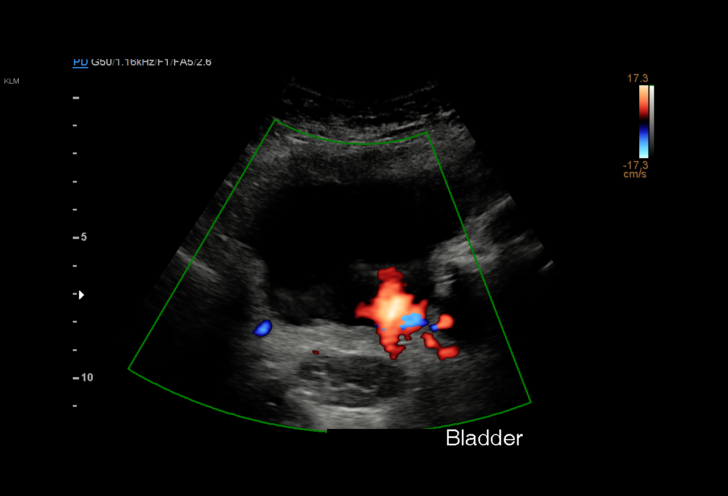
[im 22/35]
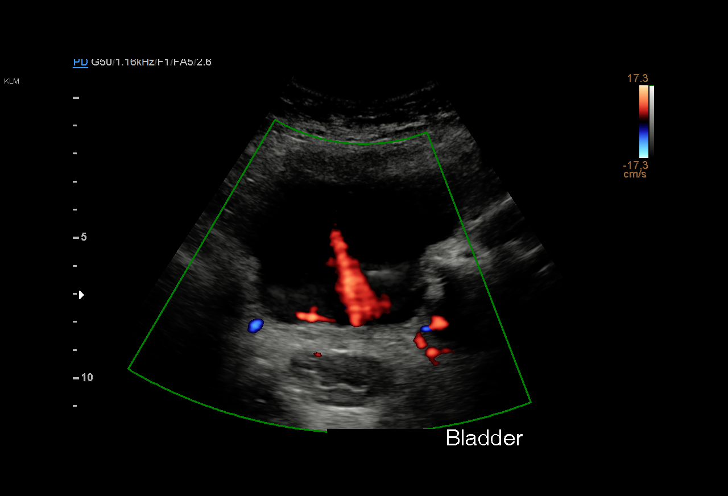
[im 25/35]
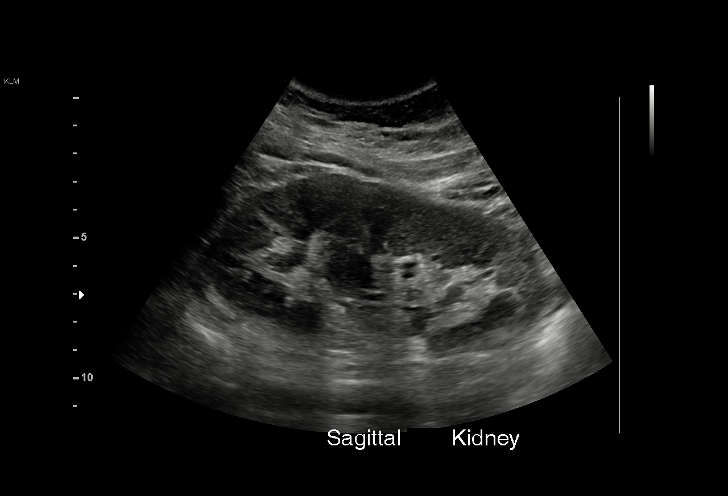
[im 27/35]
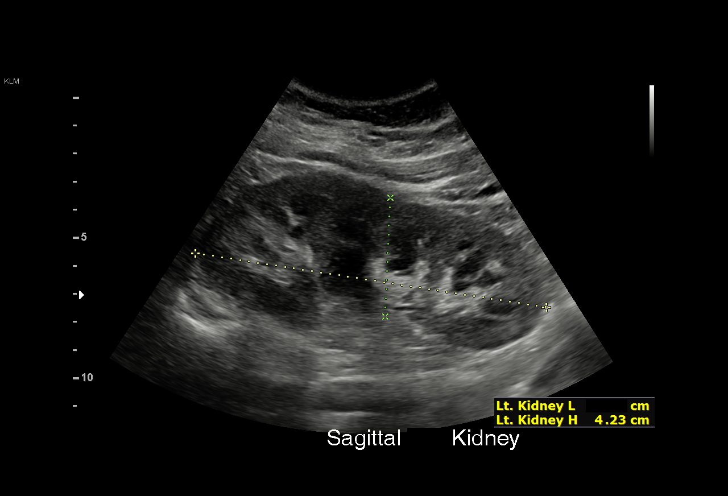
[im 29/35]
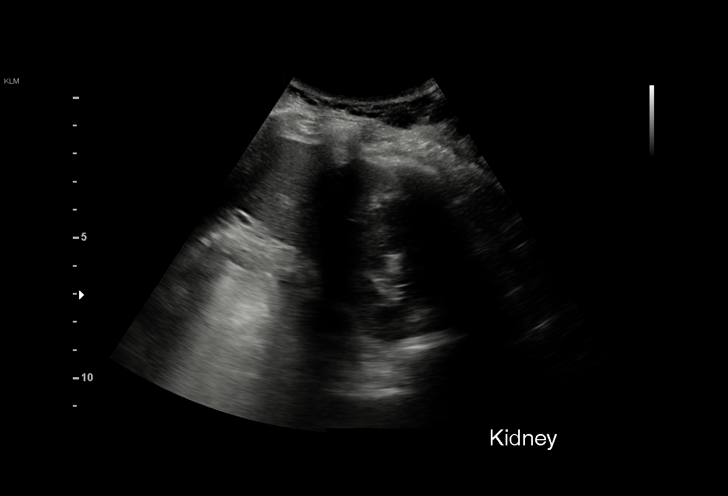
[im 32/35]
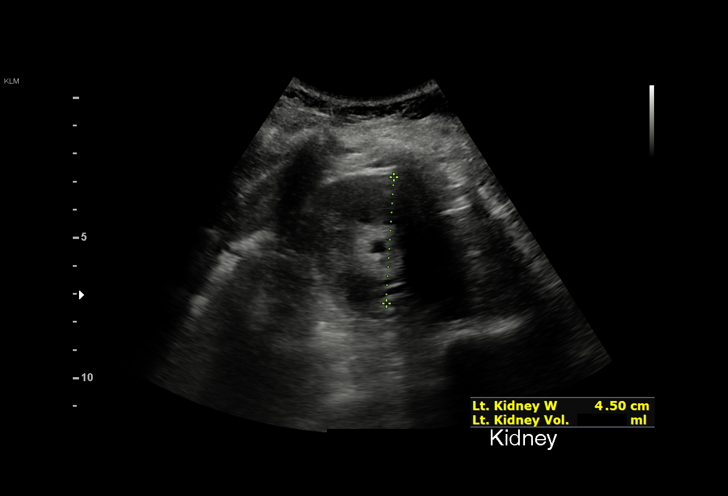
[im 35/35]
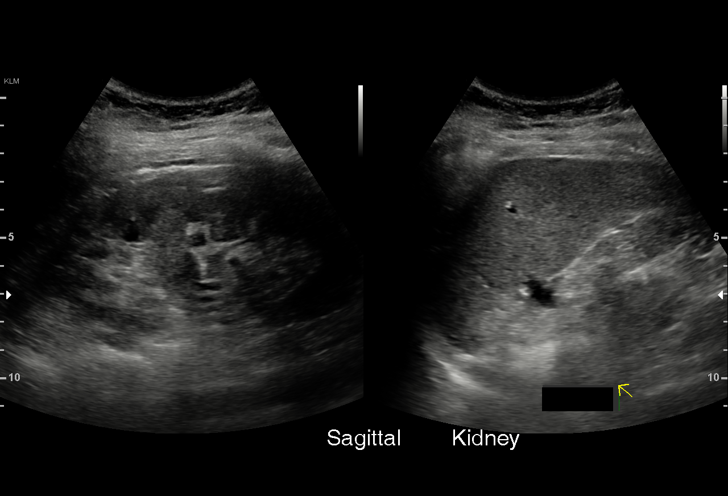

[15 of 25 positions shown; findings below may reference images not displayed]

FINDINGS: Right Kidney:

Renal measurements: 12.3 x 4.3 x 4.9 cm = volume: 134 mL.
Echogenicity within normal limits. No mass or hydronephrosis
visualized.

Left Kidney:

Renal measurements: 12.6 x 4.2 x 4.5 cm = volume: 126 mL.
Echogenicity within normal limits. No mass or hydronephrosis
visualized.

Bladder:

Appears normal for degree of bladder distention. Bilateral ureteral
jets are present.

Other:

None.
IMPRESSION: Normal renal ultrasound.

## 2023-08-11 ENCOUNTER — Telehealth: Payer: Medicaid Other | Admitting: Physician Assistant

## 2023-08-11 DIAGNOSIS — M62838 Other muscle spasm: Secondary | ICD-10-CM | POA: Diagnosis not present

## 2023-08-11 MED ORDER — CYCLOBENZAPRINE HCL 5 MG PO TABS
ORAL_TABLET | ORAL | 1 refills | Status: AC
Start: 2023-08-11 — End: ?

## 2023-08-11 MED ORDER — IBUPROFEN 800 MG PO TABS
800.0000 mg | ORAL_TABLET | Freq: Three times a day (TID) | ORAL | 0 refills | Status: AC | PRN
Start: 2023-08-11 — End: ?

## 2023-08-11 NOTE — Progress Notes (Signed)
E-Visit for Back Pain    We are sorry that you are not feeling well.  Here is how we plan to help!   Based on what you have shared with me it looks like you mostly have acute back pain.   Acute back pain is defined as musculoskeletal pain that can resolve in 1-3 weeks with conservative treatment.   I have prescribed Ibuprofen 800 mg non-steroid anti-inflammatory (NSAID) as well as Flexeril 10 mg every eight hours as needed which is a muscle relaxer  I am sending the flexeril in a 5mg  dose, you can take 1-2 every 8 hours as needed.  I sent it that way so hopefully it will not make you sleepy. Some patients experience stomach irritation or in increased heartburn with anti-inflammatory drugs.  Please keep in mind that muscle relaxer's can cause fatigue and should not be taken while at work or driving.  Back pain is very common.  The pain often gets better over time.  The cause of back pain is usually not dangerous.  Most people can learn to manage their back pain on their own.   Home Care Stay active.  Start with short walks on flat ground if you can.  Try to walk farther each day. Do not sit, drive or stand in one place for more than 30 minutes.  Do not stay in bed. Do not avoid exercise or work.  Activity can help your back heal faster. Be careful when you bend or lift an object.  Bend at your knees, keep the object close to you, and do not twist. Sleep on a firm mattress.  Lie on your side, and bend your knees.  If you lie on your back, put a pillow under your knees. Only take medicines as told by your doctor. Put ice on the injured area. Put ice in a plastic bag Place a towel between your skin and the bag Leave the ice on for 15-20 minutes, 3-4 times a day for the first 2-3 days. 210 After that, you can switch between ice and heat packs. Ask your doctor about back exercises or massage. Avoid feeling anxious or stressed.  Find good ways to deal with stress, such as exercise.   Get Help Right  Way If: Your pain does not go away with rest or medicine. Your pain does not go away in 1 week. You have new problems. You do not feel well. The pain spreads into your legs. You cannot control when you poop (bowel movement) or pee (urinate) You feel sick to your stomach (nauseous) or throw up (vomit) You have belly (abdominal) pain. You feel like you may pass out (faint). If you develop a fever.   Make Sure you: Understand these instructions. Will watch your condition Will get help right away if you are not doing well or get worse.   Your e-visit answers were reviewed by a board certified advanced clinical practitioner to complete your personal care plan.  Depending on the condition, your plan could have included both over the counter or prescription medications.   If there is a problem please reply  once you have received a response from your provider.   Your safety is important to [place].  If you have drug allergies check your prescription carefully.     You can use MyChart to ask questions about today's visit, request a non-urgent call back, or ask for a work or school excuse for 24 hours related to this e-Visit. If it has  been greater than 24 hours you will need to follow up with your provider, or enter a new e-Visit to address those concerns.   You will get an e-mail in the next two days asking about your experience.  I hope that your e-visit has been valuable and will speed your recovery. Thank you for using e-visits. I have spent 5 minutes in review of e-visit questionnaire, review and updating patient chart, medical decision making and response to patient.   Kasandra Knudsen Mayers, PA-C

## 2024-03-11 ENCOUNTER — Encounter (HOSPITAL_COMMUNITY): Payer: Self-pay | Admitting: Emergency Medicine

## 2024-03-11 ENCOUNTER — Other Ambulatory Visit: Payer: Self-pay

## 2024-03-11 ENCOUNTER — Emergency Department (HOSPITAL_COMMUNITY)

## 2024-03-11 ENCOUNTER — Emergency Department (HOSPITAL_COMMUNITY)
Admission: EM | Admit: 2024-03-11 | Discharge: 2024-03-11 | Disposition: A | Discharge status: Home / Self Care | Source: Ambulatory Visit | Attending: Emergency Medicine | Admitting: Emergency Medicine

## 2024-03-11 DIAGNOSIS — U071 COVID-19: Secondary | ICD-10-CM | POA: Insufficient documentation

## 2024-03-11 DIAGNOSIS — R0602 Shortness of breath: Secondary | ICD-10-CM | POA: Diagnosis present

## 2024-03-11 DIAGNOSIS — R000 Tachycardia, unspecified: Secondary | ICD-10-CM | POA: Diagnosis not present

## 2024-03-11 DIAGNOSIS — J45909 Unspecified asthma, uncomplicated: Secondary | ICD-10-CM | POA: Insufficient documentation

## 2024-03-11 DIAGNOSIS — R0789 Other chest pain: Secondary | ICD-10-CM | POA: Diagnosis not present

## 2024-03-11 LAB — CBC WITH DIFFERENTIAL/PLATELET
Abs Immature Granulocytes: 0.01 K/uL (ref 0.00–0.07)
Basophils Absolute: 0 K/uL (ref 0.0–0.1)
Basophils Relative: 0 %
Eosinophils Absolute: 0.1 K/uL (ref 0.0–0.5)
Eosinophils Relative: 2 %
HCT: 38 % (ref 36.0–46.0)
Hemoglobin: 12.6 g/dL (ref 12.0–15.0)
Immature Granulocytes: 0 %
Lymphocytes Relative: 18 %
Lymphs Abs: 1.2 K/uL (ref 0.7–4.0)
MCH: 29 pg (ref 26.0–34.0)
MCHC: 33.2 g/dL (ref 30.0–36.0)
MCV: 87.6 fL (ref 80.0–100.0)
Monocytes Absolute: 0.6 K/uL (ref 0.1–1.0)
Monocytes Relative: 9 %
Neutro Abs: 4.5 K/uL (ref 1.7–7.7)
Neutrophils Relative %: 71 %
Platelets: 241 K/uL (ref 150–400)
RBC: 4.34 MIL/uL (ref 3.87–5.11)
RDW: 12.6 % (ref 11.5–15.5)
WBC: 6.4 K/uL (ref 4.0–10.5)
nRBC: 0 % (ref 0.0–0.2)

## 2024-03-11 LAB — COMPREHENSIVE METABOLIC PANEL WITH GFR
ALT: 13 U/L (ref 0–44)
AST: 16 U/L (ref 15–41)
Albumin: 3.7 g/dL (ref 3.5–5.0)
Alkaline Phosphatase: 67 U/L (ref 38–126)
Anion gap: 10 (ref 5–15)
BUN: 6 mg/dL (ref 6–20)
CO2: 26 mmol/L (ref 22–32)
Calcium: 8.5 mg/dL — ABNORMAL LOW (ref 8.9–10.3)
Chloride: 104 mmol/L (ref 98–111)
Creatinine, Ser: 0.69 mg/dL (ref 0.44–1.00)
GFR, Estimated: >60 mL/min (ref >60)
Glucose, Bld: 98 mg/dL (ref 70–99)
Potassium: 3.7 mmol/L (ref 3.5–5.1)
Sodium: 140 mmol/L (ref 135–145)
Total Bilirubin: 0.4 mg/dL (ref 0.0–1.2)
Total Protein: 7.1 g/dL (ref 6.5–8.1)

## 2024-03-11 LAB — HCG, SERUM, QUALITATIVE: Preg, Serum: NEGATIVE

## 2024-03-11 LAB — D-DIMER, QUANTITATIVE: D-Dimer, Quant: 0.47 ug{FEU}/mL (ref 0.00–0.50)

## 2024-03-11 MED ORDER — LACTATED RINGERS IV BOLUS
1000.0000 mL | Freq: Once | INTRAVENOUS | Status: AC
  Administered 2024-03-11: 1000 mL via INTRAVENOUS

## 2024-03-11 MED ORDER — ACETAMINOPHEN 500 MG PO TABS
1000.0000 mg | ORAL_TABLET | Freq: Once | ORAL | Status: AC
  Administered 2024-03-11: 1000 mg via ORAL
  Filled 2024-03-11: qty 2

## 2024-03-11 MED ORDER — GUAIFENESIN 100 MG/5ML PO LIQD
5.0000 mL | Freq: Once | ORAL | Status: AC
  Administered 2024-03-11: 5 mL via ORAL
  Filled 2024-03-11: qty 5

## 2024-03-11 MED ORDER — KETOROLAC TROMETHAMINE 15 MG/ML IJ SOLN
15.0000 mg | Freq: Once | INTRAMUSCULAR | Status: AC
  Administered 2024-03-11: 15 mg via INTRAVENOUS
  Filled 2024-03-11: qty 1

## 2024-03-11 NOTE — ED Provider Notes (Signed)
  Physical Exam  BP 99/84   Pulse 95   Temp 98.5 F (36.9 C) (Oral)   Resp 17   SpO2 98%   Physical Exam Vitals and nursing note reviewed.  Constitutional:      General: She is not in acute distress.    Appearance: She is well-developed.  HENT:     Head: Normocephalic and atraumatic.  Eyes:     Conjunctiva/sclera: Conjunctivae normal.  Cardiovascular:     Rate and Rhythm: Normal rate and regular rhythm.     Heart sounds: No murmur heard. Pulmonary:     Effort: Pulmonary effort is normal. No respiratory distress.     Breath sounds: Normal breath sounds.  Abdominal:     Palpations: Abdomen is soft.     Tenderness: There is no abdominal tenderness.  Musculoskeletal:        General: No swelling.     Cervical back: Neck supple.  Skin:    General: Skin is warm and dry.     Capillary Refill: Capillary refill takes less than 2 seconds.  Neurological:     Mental Status: She is alert.  Psychiatric:        Mood and Affect: Mood normal.     Procedures  Procedures  ED Course / MDM   Clinical Course as of 03/11/24 1640  Tue Mar 11, 2024  1530 DG Chest Portable 1 View No active disease. [HN]    Clinical Course User Index [HN] Franklyn Sid SAILOR, MD   Medical Decision Making Amount and/or Complexity of Data Reviewed Labs: ordered. Radiology: ordered. Decision-making details documented in ED Course.  Risk OTC drugs. Prescription drug management.    Received patient in signout.  Patient is a 28 year old female.  Complained of chest pain and shortness of breath.  Tested positive for COVID.  Patient has been hemodynamically stable.  Laboratory workup unremarkable.   Signed out to me pending D-dimer.  If D-dimer negative, can likely be discharged home.   Reviewed EKG.  Sinus rhythm.  No STEMI.   Patient was borderline tachycardic at 1 point, did obtain D-dimer to rule out PE.  No history of DVT or PE.  Low risk.  D-dimer unremarkable.   Reevaluated patient, patient  heart rate in the 90s.  O2 saturation high percent on room air.  Patient safe for discharge home.  Recommended supportive care.     Simon Lavonia SAILOR, MD 03/11/24 [id number]

## 2024-03-11 NOTE — ED Provider Notes (Signed)
 [place] EMERGENCY DEPARTMENT AT [facility] Provider Note   CSN: [id number] Arrival date & time: 03/11/24  1346     History {Add pertinent medical, surgical, social history, OB history to HPI:1} No chief complaint on file.   Kaitlyn Peters is a 28 y.o. female with PMH as listed below who presents with chest pain, shortness of breath, generalized body aches, cough, runny nose, sore throat that began a couple of days ago.  Patient with symptoms that began yesterday.  She rates the central sharp chest pain 7 out of 10, worse with movement.  No history of similar chest pain.  She also reports that she is distant with exertion.  Was diagnosed with COVID.  No history of DVT or PE; no history of travel, hospitalizations, or immobilizations; does not take any hormones or birth control.  She endorses fever/chills but denies nausea and vomiting/diarrhea or urinary symptoms.   Past Medical History:  Diagnosis Date   Asthma    PCOS (polycystic ovarian syndrome)        Home Medications Prior to Admission medications   Medication Sig Start Date End Date Taking? Authorizing Provider  acetaminophen  (TYLENOL ) 500 MG tablet Take 1,000 mg by mouth daily as needed (pain).    [provider]  ALPRAZolam  (XANAX ) 0.5 MG tablet Take 1 tablet (0.5 mg total) by mouth at bedtime as needed for anxiety. 04/09/23   Suellen Cantor A, PA-C  Calcium  Carbonate Antacid (TUMS PO) Take 4 tablets by mouth as needed (heartburn).    [provider]  cyclobenzaprine  (FLEXERIL ) 5 MG tablet Take 1-2 tabs PO q8hrs prn 08/11/23   Mayers, Cari S, PA-C  famotidine  (PEPCID ) 20 MG tablet Take 1 tablet (20 mg total) by mouth 2 (two) times daily. 12/24/22   Zelaya, Oscar A, PA-C  ibuprofen  (ADVIL ) 800 MG tablet Take 1 tablet (800 mg total) by mouth every 8 (eight) hours as needed. 08/11/23   Mayers, Cari S, PA-C  montelukast (SINGULAIR) 10 MG tablet Take 10 mg by mouth at bedtime.    [provider]   ondansetron  (ZOFRAN -ODT) 4 MG disintegrating tablet Take 1 tablet (4 mg total) by mouth every 8 (eight) hours as needed for nausea or vomiting. 04/22/23   Melvenia Motto, MD  phentermine (ADIPEX-P) 37.5 MG tablet Take 37.5 mg by mouth daily before breakfast. 10/13/22   [provider]  promethazine  (PHENERGAN ) 25 MG suppository Place 1 suppository (25 mg total) rectally every 6 (six) hours as needed for nausea or vomiting. 04/22/23   Melvenia Motto, MD  VENTOLIN  HFA 108 (90 Base) MCG/ACT inhaler Inhale 2 puffs into the lungs every 6 (six) hours as needed. 10/11/22   [provider]      Allergies    Metformin and related    Review of Systems   Review of Systems A 10 point review of systems was performed and is negative unless otherwise reported in HPI.  Physical Exam Updated Vital Signs BP 130/81 (BP Location: Left Arm)   Pulse 96   Temp 98.5 F (36.9 C) (Oral)   Resp 19   SpO2 100%  Physical Exam General: Normal appearing female, lying in bed.  HEENT: PERRLA, Sclera anicteric, MMM, trachea midline.  Cardiology: RRR, no murmurs/rubs/gallops. BL radial and DP pulses equal bilaterally.  Resp: Normal respiratory rate and effort. CTAB, no wheezes, rhonchi, crackles.  Abd: Soft, non-tender, non-distended. No rebound tenderness or guarding.  GU: Deferred. MSK: No peripheral edema or signs of trauma. Extremities without deformity  or TTP. No cyanosis or clubbing. Skin: warm, dry. No rashes or lesions. Back: No CVA tenderness Neuro: A&Ox4, CNs II-XII grossly intact. MAEs. Sensation grossly intact.  Psych: Normal mood and affect.   ED Results / Procedures / Treatments   Labs (all labs ordered are listed, but only abnormal results are displayed) Labs Reviewed  CBC WITH DIFFERENTIAL/PLATELET  COMPREHENSIVE METABOLIC PANEL WITH GFR  HCG, SERUM, QUALITATIVE  D-DIMER, QUANTITATIVE    EKG None  Radiology No results found.  Procedures Procedures  {Document cardiac  monitor, telemetry assessment procedure when appropriate:1}  Medications Ordered in ED Medications - No data to display  ED Course/ Medical Decision Making/ A&P                          Medical Decision Making Amount and/or Complexity of Data Reviewed Labs: ordered. Radiology: ordered.    This patient presents to the ED for concern of chest pain shortness of breath, this involves an extensive number of treatment options, and is a complaint that carries with it a high risk of complications and morbidity.  I considered the following differential and admission for this acute, potentially life threatening condition.   MDM:    DDX for chest pain includes but is not limited to:  ACS/arrhythmia,  PE, aortic dissection, PNA, PTX, esophogeal rupture, biliary disease, cardiac tamponade, pericarditis, GERD/PUD/gastritis, or musculoskeletal pain. Very low suspicion for ACS vs aortic dissection given presenting sx. Patient cannot PERC out based on tachycardia, but will obtain D dimer and reassess, minimal risk factors for PE. No c/f dissection. No abdominal pain and no c/f biliary disease. Consider GERD, given known history.   Update and Plan: -cbc, cmp, cardiac markers, mag, ECG, CXR -ECG: -CXR:       Labs: I Ordered, and personally interpreted labs.  The pertinent results include: Those listed above  Imaging Studies ordered: I ordered imaging studies including chest x-ray I independently visualized and interpreted imaging. I agree with the radiologist interpretation  Additional history obtained from chart review.  External records from outside source obtained and reviewed including ***  Cardiac Monitoring: The patient was maintained on a cardiac monitor.  I personally viewed and interpreted the cardiac monitored which showed an underlying rhythm of: Normal sinus rhythm  Reevaluation: After the interventions noted above, I reevaluated the patient and found that they have  :{resolved/improved/worsened:[id number]::improved}  Social Determinants of Health:  lives independently  Disposition:  ***  Co morbidities that complicate the patient evaluation  Past Medical History:  Diagnosis Date   Asthma    PCOS (polycystic ovarian syndrome)      Medicines No orders of the defined types were placed in this encounter.   I have reviewed the patients home medicines and have made adjustments as needed  Problem List / ED Course: Problem List Items Addressed This Visit   None        {Document critical care time when appropriate:1} {Document review of labs and clinical decision tools ie heart score, Chads2Vasc2 etc:1}  {Document your independent review of radiology images, and any outside records:1} {Document your discussion with family members, caretakers, and with consultants:1} {Document social determinants of health affecting pt's care:1} {Document your decision making why or why not admission, treatments were needed:1}  This note was created using dictation software, which may contain spelling or grammatical errors.

## 2024-03-11 NOTE — ED Triage Notes (Signed)
 Pt c/o testing positive for covid Saturday, states her son tested positive Friday and she started experiencing Sx Saturday: CP, SHOB, generalized body aches, cough, runny nose. Rates CP 7/10 at this time, states she is especially shob with movement. SpO2 on RA 100% at this time

## 2024-03-11 NOTE — Discharge Instructions (Signed)
 Please stay well-hydrated.  If you have any Worsening chest pain or shortness of breath please come back to ED for further evaluation.  Please follow-up with your PCP.   Your workup today was unremarkable.

## 2024-03-12 NOTE — Telephone Encounter (Signed)
 Patient ID:  Kaitlyn Peters is a 28 y.o. (DOB 1996/04/21) female  E-Visit Assessment   1. COVID    E-Visit Plan     Patient's Medications       * Accurate as of March 12, 2024  9:23 AM. Reflects encounter med changes as of last refresh          New Prescriptions      Instructions  benzonatate 200 MG capsule Commonly known as: TESSALON  200 mg, Oral, 3 times a day as needed   nirmatrelvir - ritonavir 300 mg - 100 mg tablet pack Commonly known as: PAXLOVID  3 tablets, Oral, 2 times a day, Take two (2) nirmatrelvir 150 mg tablets and one (1) ritonavir 100 mg tablet as indicated on the medication dosing blister pack.       Continued Medications      Instructions  albuterol  sulfate HFA 108 (90 Base) MCG/ACT inhaler Commonly known as: PROVENTIL ,VENTOLIN ,PROAIR   2 puffs, Inhalation, Every 6 hours as needed   buPROPion 100 mg 12 hr tablet Commonly known as: WELLBUTRIN SR  100 mg, Oral, 2 times a day   doxycycline hyclate 100 mg tablet Commonly known as: VIBRA-TABS  100 mg, Oral, Daily   fluconazole  150 mg tablet Commonly known as: DIFLUCAN   Take one tablet today, repeat in 3 days   FLUoxetine 10 mg capsule Commonly known as: PROZAC  10 mg, Oral, Daily   ibuprofen  600 mg tablet Commonly known as: ADVIL ,MOTRIN   600 mg, Every 6 hours scheduled   ondansetron  4 mg disintegrating tablet Commonly known as: ZOFRAN -ODT  4 mg, Oral, Every 8 hours as needed   semaglutide-weight management 0.5 mg/0.5 mL Soaj injection Commonly known as: WEGOVY  0.5 mg, Subcutaneous, Weekly        Risk, benefits, and alternatives were provided through patient instructions given to the patient electronically.  If any worsening symptoms or lack of improvement, the patient will seek immediate medical care.  E-Visit History  HPI is reviewed through E-Visit questionnaire.  Patient is appropriate for E-Visit and consented online understanding potential risks, benefits and  alternatives of E-Visit delivery of care.  Patient Active Problem List   Diagnosis Date Noted  . Major depressive disorder, recurrent episode, moderate degree (*) 01/22/2024  . Generalized anxiety disorder 01/22/2024  . Class 1 obesity due to excess calories with serious comorbidity and body mass index (BMI) of 32.0 to 32.9 in adult 10/19/2023  . Mixed hyperlipidemia 05/28/2023  . Pain of upper abdomen 05/03/2023  . Chronic diarrhea 05/03/2023  . Nausea and vomiting 05/03/2023  . GERD (gastroesophageal reflux disease) 05/03/2023  . Pilonidal cyst 03/22/2023  . Vitamin D insufficiency 09/29/2022  . Infectious enteritis 07/14/2022  . Asthma (*) 06/30/2022  . PCOS (polycystic ovarian syndrome) 08/15/2017   Allergies  Allergen Reactions  . Metformin And Related Diarrhea    Intolerance    E-Visit Required Time Documentation  I spent 5 minutes on this issue.    *Some images could not be shown.

## 2024-03-28 ENCOUNTER — Other Ambulatory Visit: Payer: Self-pay

## 2024-03-28 ENCOUNTER — Emergency Department (HOSPITAL_COMMUNITY)
Admission: EM | Admit: 2024-03-28 | Discharge: 2024-03-29 | Disposition: A | Discharge status: Home / Self Care | Attending: Emergency Medicine | Admitting: Emergency Medicine

## 2024-03-28 ENCOUNTER — Encounter (HOSPITAL_COMMUNITY): Payer: Self-pay | Admitting: Emergency Medicine

## 2024-03-28 DIAGNOSIS — L03112 Cellulitis of left axilla: Secondary | ICD-10-CM | POA: Insufficient documentation

## 2024-03-28 DIAGNOSIS — R1031 Right lower quadrant pain: Secondary | ICD-10-CM | POA: Diagnosis present

## 2024-03-28 DIAGNOSIS — L0390 Cellulitis, unspecified: Secondary | ICD-10-CM

## 2024-03-28 NOTE — ED Triage Notes (Signed)
 Pt states she has an abscess to L armpit for which she has 2 months of abx left. Dx with Covid 2 weeks ago. States that tonight, abscess is more swollen, she has had chills and fever at home of 101.3. Pt states she just doesn't feel well. Last dose of Tylenol  was at 2130.

## 2024-03-29 ENCOUNTER — Emergency Department (HOSPITAL_COMMUNITY)

## 2024-03-29 LAB — COMPREHENSIVE METABOLIC PANEL WITH GFR
ALT: 13 U/L (ref 0–44)
AST: 15 U/L (ref 15–41)
Albumin: 4.1 g/dL (ref 3.5–5.0)
Alkaline Phosphatase: 84 U/L (ref 38–126)
Anion gap: 12 (ref 5–15)
BUN: 10 mg/dL (ref 6–20)
CO2: 25 mmol/L (ref 22–32)
Calcium: 9.2 mg/dL (ref 8.9–10.3)
Chloride: 100 mmol/L (ref 98–111)
Creatinine, Ser: 0.77 mg/dL (ref 0.44–1.00)
GFR, Estimated: >60 mL/min (ref >60)
Glucose, Bld: 96 mg/dL (ref 70–99)
Potassium: 3.5 mmol/L (ref 3.5–5.1)
Sodium: 137 mmol/L (ref 135–145)
Total Bilirubin: 0.6 mg/dL (ref 0.0–1.2)
Total Protein: 8 g/dL (ref 6.5–8.1)

## 2024-03-29 LAB — CBC
HCT: 39.6 % (ref 36.0–46.0)
Hemoglobin: 13.5 g/dL (ref 12.0–15.0)
MCH: 29.4 pg (ref 26.0–34.0)
MCHC: 34.1 g/dL (ref 30.0–36.0)
MCV: 86.3 fL (ref 80.0–100.0)
Platelets: 323 K/uL (ref 150–400)
RBC: 4.59 MIL/uL (ref 3.87–5.11)
RDW: 12.5 % (ref 11.5–15.5)
WBC: 11.9 K/uL — ABNORMAL HIGH (ref 4.0–10.5)
nRBC: 0 % (ref 0.0–0.2)

## 2024-03-29 LAB — URINALYSIS, ROUTINE W REFLEX MICROSCOPIC
Bilirubin Urine: NEGATIVE
Glucose, UA: NEGATIVE mg/dL
Ketones, ur: NEGATIVE mg/dL
Leukocytes,Ua: NEGATIVE
Nitrite: NEGATIVE
Protein, ur: NEGATIVE mg/dL
RBC / HPF: >50 RBC/hpf (ref 0–5)
Specific Gravity, Urine: 1.021 (ref 1.005–1.030)
pH: 6 (ref 5.0–8.0)

## 2024-03-29 LAB — LIPASE, BLOOD: Lipase: 33 U/L (ref 11–51)

## 2024-03-29 LAB — PREGNANCY, URINE: Preg Test, Ur: NEGATIVE

## 2024-03-29 MED ORDER — KETOROLAC TROMETHAMINE 15 MG/ML IJ SOLN
15.0000 mg | Freq: Once | INTRAMUSCULAR | Status: AC
  Administered 2024-03-29: 15 mg via INTRAVENOUS
  Filled 2024-03-29: qty 1

## 2024-03-29 MED ORDER — IOHEXOL 300 MG/ML  SOLN
100.0000 mL | Freq: Once | INTRAMUSCULAR | Status: AC | PRN
  Administered 2024-03-29: 100 mL via INTRAVENOUS

## 2024-03-29 MED ORDER — DOXYCYCLINE HYCLATE 100 MG PO CAPS: 100.0000 mg | ORAL_CAPSULE | Freq: Two times a day (BID) | ORAL | 0 refills | Status: AC

## 2024-03-29 MED ORDER — ONDANSETRON HCL 4 MG/2ML IJ SOLN
4.0000 mg | Freq: Once | INTRAMUSCULAR | Status: AC
  Administered 2024-03-29: 4 mg via INTRAVENOUS
  Filled 2024-03-29: qty 2

## 2024-03-29 MED ORDER — NAPROXEN 500 MG PO TABS: 500.0000 mg | ORAL_TABLET | Freq: Two times a day (BID) | ORAL | 0 refills | Status: AC

## 2024-03-29 MED ORDER — SODIUM CHLORIDE 0.9 % IV BOLUS
1000.0000 mL | Freq: Once | INTRAVENOUS | Status: AC
  Administered 2024-03-29: 1000 mL via INTRAVENOUS

## 2024-03-29 NOTE — Discharge Instructions (Signed)
 You were evaluated in the Emergency Department and after careful evaluation, we did not find any emergent condition requiring admission or further testing in the hospital.  Your exam/testing today is overall reassuring.  Recommend restarting the doxycycline  twice a day, can also use the Naprosyn  twice daily for pain.  Follow-up with dermatology.  Please return to the Emergency Department if you experience any worsening of your condition.   Thank you for allowing us  to be a part of your care.

## 2024-03-29 NOTE — ED Provider Notes (Signed)
 [facility]ER[facility]Y DEPT [facility] Emergency Department Provider Note MRN:  [id number]  Arrival date & time: 03/29/24     Chief Complaint   Abscess   History of Present Illness   Kaitlyn Peters is a 28 y.o. year-old female with a history of PCOS presenting to the ED with chief complaint of abscess.  Patient explains that she is having increased pain to her left axilla recently.  Her doctors are considering a diagnosis of hidradenitis given her recurrent abscess in this area.  Has required 2 incision and drainage procedures, was on long-term antibiotics but lost access to the antibiotics for several days recently.  Today she also began experiencing right lower quadrant abdominal pain, nausea vomiting, and fever up to 101.3.  Review of Systems  A thorough review of systems was obtained and all systems are negative except as noted in the HPI and PMH.   Patient's Health History    Past Medical History:  Diagnosis Date   Asthma    PCOS (polycystic ovarian syndrome)     Past Surgical History:  Procedure Laterality Date   TONSILLECTOMY     WISDOM TOOTH EXTRACTION Bilateral     Family History  Problem Relation Age of Onset   Breast cancer Maternal Grandmother    Breast cancer Paternal Grandmother     Social History   Socioeconomic History   Marital status: Significant Other    Spouse name: Not on file   Number of children: Not on file   Years of education: Not on file   Highest education level: Not on file  Occupational History   Not on file  Tobacco Use   Smoking status: Never   Smokeless tobacco: Never  Vaping Use   Vaping status: Former   Substances: Nicotine  Substance and Sexual Activity   Alcohol use: Never   Drug use: Never   Sexual activity: Yes    Birth control/protection: None  Other Topics Concern   Not on file  Social History Narrative   Not on file   Social Drivers of Health   Financial Resource Strain: Low Risk  (10/18/2023)   Received from [facility]   Overall Financial Resource Strain (CARDIA)    Difficulty of Paying Living Expenses: Not hard at all  Food Insecurity: No Food Insecurity (10/18/2023)   Received from [facility]   Hunger Vital Sign    Within the past 12 months, you worried that your food would run out before you got the money to buy more.: Never true    Within the past 12 months, the food you bought just didn't last and you didn't have money to get more.: Never true  Transportation Needs: Unmet Transportation Needs (10/18/2023)   Received from [facility] - Transportation    Lack of Transportation (Medical): Yes    Lack of Transportation (Non-Medical): No  Physical Activity: Insufficiently Active (10/18/2023)   Received from [facility]   Exercise Vital Sign    On average, how many days per week do you engage in moderate to strenuous exercise (like a brisk walk)?: 3 days    On average, how many minutes do you engage in exercise at this level?: 20 min  Stress: No Stress Concern Present (10/18/2023)   Received from [facility] - Occupational Stress Questionnaire    Feeling of Stress : Only a little  Social Connections: Somewhat Isolated (10/18/2023)   Received from [facility]   Social Network  How would you rate your social network (family, work, friends)?: Restricted participation with some degree of social isolation  Intimate Partner Violence: Not At Risk (10/18/2023)   Received from [facility]   HITS    Over the last 12 months how often did your partner physically hurt you?: Never    Over the last 12 months how often did your partner insult you or talk down to you?: Never    Over the last 12 months how often did your partner threaten you with physical harm?: Never    Over the last 12 months how often did your partner scream or curse at you?: Never     Physical Exam   Vitals:   03/28/24 2345 03/29/24 0115  BP: 120/87 104/69  Pulse: 100 81   Resp: 17 14  Temp:    SpO2: 99% 97%    CONSTITUTIONAL: Well-appearing, NAD NEURO/PSYCH:  Alert and oriented x 3, no focal deficits EYES:  eyes equal and reactive ENT/NECK:  no LAD, no JVD CARDIO: Regular rate, well-perfused, normal S1 and S2 PULM:  CTAB no wheezing or rhonchi GI/GU:  non-distended, mild right lower quadrant tenderness to palpation MSK/SPINE:  No gross deformities, no edema SKIN: Small area of erythema and tenderness to the left axilla   *Additional and/or pertinent findings included in MDM below  Diagnostic and Interventional Summary    EKG Interpretation Date/Time:    Ventricular Rate:    PR Interval:    QRS Duration:    QT Interval:    QTC Calculation:   R Axis:      Text Interpretation:         Labs Reviewed  CBC - Abnormal; Notable for the following components:      Result Value   WBC 11.9 (*)    All other components within normal limits  URINALYSIS, ROUTINE W REFLEX MICROSCOPIC - Abnormal; Notable for the following components:   APPearance HAZY (*)    Hgb urine dipstick MODERATE (*)    Bacteria, UA RARE (*)    All other components within normal limits  COMPREHENSIVE METABOLIC PANEL WITH GFR  LIPASE, BLOOD  PREGNANCY, URINE    CT CHEST ABDOMEN PELVIS W CONTRAST  Final Result      Medications  ketorolac  (TORADOL ) 15 MG/ML injection 15 mg (15 mg Intravenous Given 03/29/24 0050)  ondansetron  (ZOFRAN ) injection 4 mg (4 mg Intravenous Given 03/29/24 0050)  sodium chloride  0.9 % bolus 1,000 mL (0 mLs Intravenous Stopped 03/29/24 0251)  iohexol  (OMNIPAQUE ) 300 MG/ML solution 100 mL (100 mLs Intravenous Contrast Given 03/29/24 0130)     Procedures  /  Critical Care Procedures  ED Course and Medical Decision Making  Initial Impression and Ddx Reported fever at home with malaise, right lower quadrant pain, nausea vomiting could suggest appendicitis versus gastroenteritis.  She is also having this persistent pain or abscess to the left axilla raising  consideration for deeper space abscess or infection.  Well-appearing with reassuring vitals at this time.  Will obtain CT imaging to further evaluate.  Past medical/surgical history that increases complexity of ED encounter: None  Interpretation of Diagnostics I personally reviewed the Laboratory Testing and my interpretation is as follows: No significant blood count or electrolyte disturbance.  Minimal leukocytosis  CT is without evidence of appendicitis, mild edema of the axilla without abscess or deeper space infection  Patient Reassessment and Ultimate Disposition/Management     Patient continues to have normal vitals, well-appearing on reassessment.  Appropriate for discharge, will  restart her on antibiotics.  She has follow-up with dermatology.  Patient management required discussion with the following services or consulting groups:  None  Complexity of Problems Addressed Acute illness or injury that poses threat of life of bodily function  Additional Data Reviewed and Analyzed Further history obtained from: None  Additional Factors Impacting ED Encounter Risk Prescriptions  Ozell HERO. Theadore, MD [facility] Emergency Medicine [facility] [email or web address]  Final Clinical Impressions(s) / ED Diagnoses     ICD-10-CM   1. Cellulitis, unspecified cellulitis site  L03.90       ED Discharge Orders          Ordered    naproxen  (NAPROSYN ) 500 MG tablet  2 times daily        03/29/24 0251    doxycycline  (VIBRAMYCIN ) 100 MG capsule  2 times daily        03/29/24 0251             Discharge Instructions Discussed with and Provided to Patient:     Discharge Instructions      You were evaluated in the Emergency Department and after careful evaluation, we did not find any emergent condition requiring admission or further testing in the hospital.  Your exam/testing today is overall reassuring.  Recommend restarting the doxycycline  twice a day, can  also use the Naprosyn  twice daily for pain.  Follow-up with dermatology.  Please return to the Emergency Department if you experience any worsening of your condition.   Thank you for allowing us  to be a part of your care.       Theadore Ozell HERO, MD 03/29/24 [id number]

## 2024-06-30 ENCOUNTER — Ambulatory Visit: Admitting: Obstetrics & Gynecology

## 2024-07-21 ENCOUNTER — Other Ambulatory Visit (HOSPITAL_COMMUNITY)
Admission: RE | Admit: 2024-07-21 | Discharge: 2024-07-21 | Disposition: A | Source: Ambulatory Visit | Attending: Obstetrics and Gynecology | Admitting: Obstetrics and Gynecology

## 2024-07-21 ENCOUNTER — Ambulatory Visit: Admitting: Obstetrics and Gynecology

## 2024-07-21 ENCOUNTER — Encounter: Payer: Self-pay | Admitting: Obstetrics and Gynecology

## 2024-07-21 ENCOUNTER — Ambulatory Visit

## 2024-07-21 VITALS — BP 118/80 | HR 90 | Ht 61.0 in | Wt 176.0 lb

## 2024-07-21 DIAGNOSIS — N898 Other specified noninflammatory disorders of vagina: Secondary | ICD-10-CM | POA: Diagnosis present

## 2024-07-21 DIAGNOSIS — R102 Pelvic and perineal pain unspecified side: Secondary | ICD-10-CM

## 2024-07-21 DIAGNOSIS — N911 Secondary amenorrhea: Secondary | ICD-10-CM

## 2024-07-21 DIAGNOSIS — Z3202 Encounter for pregnancy test, result negative: Secondary | ICD-10-CM | POA: Diagnosis not present

## 2024-07-21 DIAGNOSIS — N941 Unspecified dyspareunia: Secondary | ICD-10-CM

## 2024-07-21 LAB — POCT URINE PREGNANCY: Preg Test, Ur: NEGATIVE

## 2024-07-21 MED ORDER — MEDROXYPROGESTERONE ACETATE 10 MG PO TABS: 10.0000 mg | ORAL_TABLET | Freq: Every day | ORAL | 0 refills | Status: AC

## 2024-07-21 NOTE — Progress Notes (Unsigned)
" ° °  GYNECOLOGY PROGRESS NOTE  History:  28 y.o. G1P1001 presents to [facility]. Reports she hasn't had a cycle in 40 days. Home UPT negative one week ago.Sept normal cycle, none in October, had normal cycle in November. No cycle since then. Some mild lower cramping. She has a history of PCOS. Does not desire birth control, desires pregnancy in near future.  She reports pelvic pain since postpartum around 2.5 years ago. Reports pain with intercourse. Also reports pain with different movement or long car ride   The following portions of the patient's history were reviewed and updated as appropriate: allergies, current medications, past family history, past medical history, past social history, past surgical history and problem list. Last pap smear on 02/16/22 was normal, neg HRHPV.  Health Maintenance Due  Topic Date Due   Pneumococcal Vaccine (1 of 2 - PCV) Never done   Influenza Vaccine  02/22/2024   COVID-19 Vaccine (1 - 2025-26 season) Never done     Review of Systems:  Pertinent items are noted in HPI.   Objective:  Physical Exam Blood pressure 118/80, pulse 90, height 5' 1 (1.549 m), weight 176 lb (79.8 kg), last menstrual period 06/13/2024. VS reviewed, nursing note reviewed,  Constitutional: well developed, well nourished, no distress HEENT: normocephalic Pulm/chest wall: normal effort Breast Exam: deferred Abdomen: soft Neuro: alert and oriented  Skin: warm, dry Psych: affect normal Pelvic exam: Cervix pink, visually closed, without lesion, scant white creamy discharge, vaginal walls and external genitalia normal Bimanual exam: neg CMT, uterus nontender, nonenlarged, adnexa without tenderness, enlargement, or mass  Assessment & Plan:   1. Amenorrhea, secondary (Primary) Does not desire hormonal management due to future pregnancy Cannot tolerated metformin d/t side effects Discussed trial of provera for withdrawal bleed  - US  PELVIC COMPLETE WITH TRANSVAGINAL;  Future - POCT urine pregnancy - medroxyPROGESTERone (PROVERA) 10 MG tablet; Take 1 tablet (10 mg total) by mouth daily.  Dispense: 10 tablet; Refill: 0  2. Pelvic pain  - US  PELVIC COMPLETE WITH TRANSVAGINAL; Future - Cervicovaginal ancillary only( [place]) - Ambulatory referral to Physical Therapy  3. Dyspareunia in female Discussed trial of pelvic PT Swab collected today  - Ambulatory referral to Physical Therapy  4. Vaginal odor  - Cervicovaginal ancillary only( [place])  Nidia Daring, FNP  "

## 2024-07-22 LAB — CERVICOVAGINAL ANCILLARY ONLY
Bacterial Vaginitis (gardnerella): NEGATIVE
Candida Glabrata: NEGATIVE
Candida Vaginitis: NEGATIVE
Comment: NEGATIVE
Comment: NEGATIVE
Comment: NEGATIVE

## 2024-07-28 ENCOUNTER — Ambulatory Visit: Payer: Self-pay | Admitting: Obstetrics and Gynecology

## 2024-09-11 ENCOUNTER — Ambulatory Visit: Admitting: Physical Therapy
